# Patient Record
Sex: Female | Born: 1970
Health system: Southern US, Community
[De-identification: ages and names within clinical notes are randomized; demographics above are authoritative.]

## PROBLEM LIST (undated history)

## (undated) DIAGNOSIS — N3281 Overactive bladder: Secondary | ICD-10-CM

## (undated) DIAGNOSIS — N879 Dysplasia of cervix uteri, unspecified: Secondary | ICD-10-CM

## (undated) HISTORY — PX: TONSILLECTOMY AND ADENOIDECTOMY: SUR1326

## (undated) HISTORY — DX: Dysplasia of cervix uteri, unspecified: N87.9

## (undated) HISTORY — DX: Overactive bladder: N32.81

## (undated) HISTORY — PX: CERVICAL BIOPSY  W/ LOOP ELECTRODE EXCISION: SUR135

---

## 2004-10-07 ENCOUNTER — Other Ambulatory Visit: Admission: RE | Admit: 2004-10-07 | Discharge: 2004-10-07 | Payer: Self-pay | Admitting: Family Medicine

## 2005-10-17 ENCOUNTER — Other Ambulatory Visit: Admission: RE | Admit: 2005-10-17 | Discharge: 2005-10-17 | Payer: Self-pay | Admitting: Family Medicine

## 2007-04-29 ENCOUNTER — Other Ambulatory Visit: Admission: RE | Admit: 2007-04-29 | Discharge: 2007-04-29 | Payer: Self-pay | Admitting: Family Medicine

## 2008-05-01 ENCOUNTER — Other Ambulatory Visit: Admission: RE | Admit: 2008-05-01 | Discharge: 2008-05-01 | Payer: Self-pay | Admitting: Family Medicine

## 2008-05-05 ENCOUNTER — Ambulatory Visit (HOSPITAL_COMMUNITY): Admission: RE | Admit: 2008-05-05 | Discharge: 2008-05-05 | Payer: Self-pay | Admitting: Family Medicine

## 2008-06-23 DIAGNOSIS — N879 Dysplasia of cervix uteri, unspecified: Secondary | ICD-10-CM

## 2008-06-23 HISTORY — DX: Dysplasia of cervix uteri, unspecified: N87.9

## 2009-04-23 HISTORY — PX: COLPOSCOPY: SHX161

## 2009-05-03 ENCOUNTER — Other Ambulatory Visit: Admission: RE | Admit: 2009-05-03 | Discharge: 2009-05-03 | Payer: Self-pay | Admitting: Family Medicine

## 2009-05-11 ENCOUNTER — Ambulatory Visit: Payer: Self-pay | Admitting: Gynecology

## 2009-05-14 ENCOUNTER — Ambulatory Visit: Payer: Self-pay | Admitting: Gynecology

## 2009-05-21 ENCOUNTER — Ambulatory Visit: Payer: Self-pay | Admitting: Gynecology

## 2009-08-30 ENCOUNTER — Ambulatory Visit: Payer: Self-pay | Admitting: Gynecology

## 2009-12-03 ENCOUNTER — Ambulatory Visit: Payer: Self-pay | Admitting: Gynecology

## 2009-12-03 ENCOUNTER — Other Ambulatory Visit: Admission: RE | Admit: 2009-12-03 | Discharge: 2009-12-03 | Payer: Self-pay | Admitting: Gynecology

## 2009-12-25 IMAGING — US US TRANSVAGINAL NON-OB
1 series · 13 of 25 positions shown · non-contrast
Comparison: None

CLINICAL DATA: Dysfunctional uterine bleeding.  Prolonged menses
for 3 months.  LMP 04/16/2008

TRANSABDOMINAL AND TRANSVAGINAL ULTRASOUND OF PELVIS
TECHNIQUE: Both transabdominal and transvaginal ultrasound
examinations of the pelvis were performed including evaluation of
the uterus, ovaries, adnexal regions, and pelvic cul-de-sac.

[Series 1: us transvaginal non-ob · 13 of 50 slices shown]
[im 1/50]
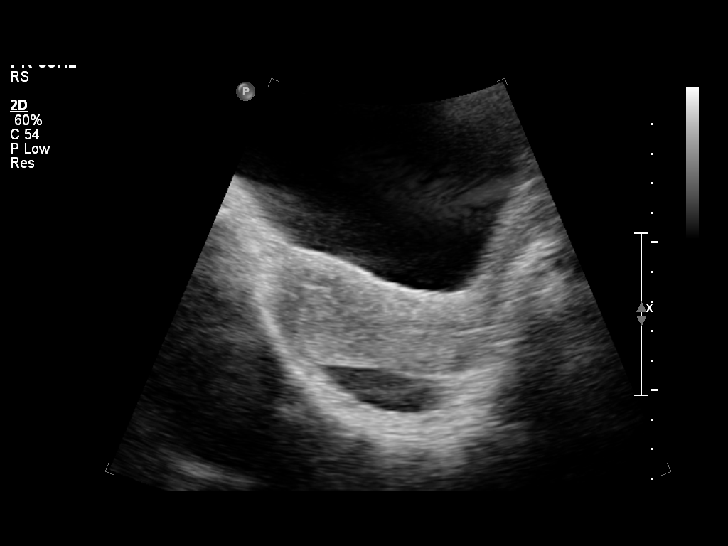
[im 5/50]
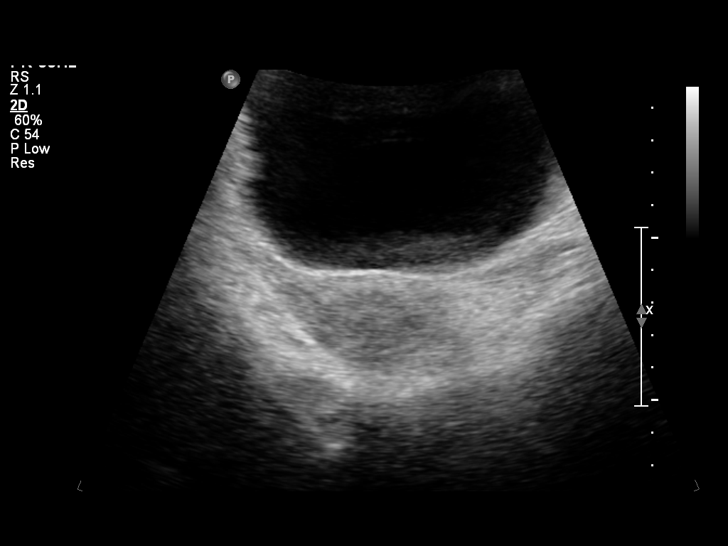
[im 9/50]
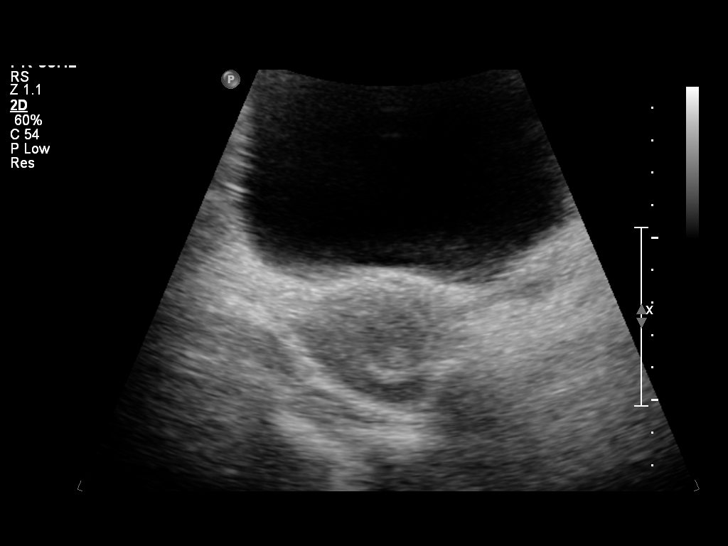
[im 13/50]
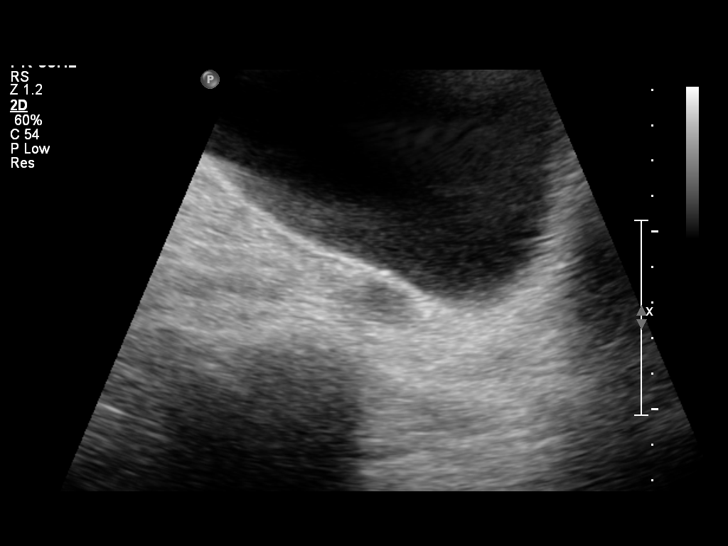
[im 17/50]
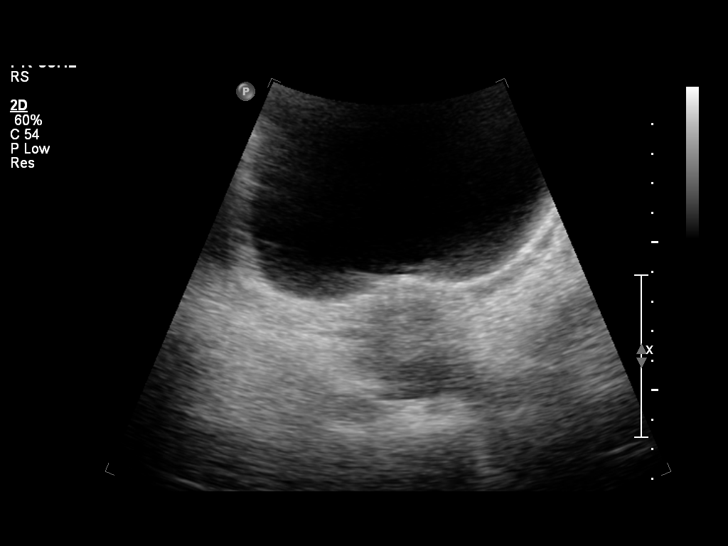
[im 21/50]
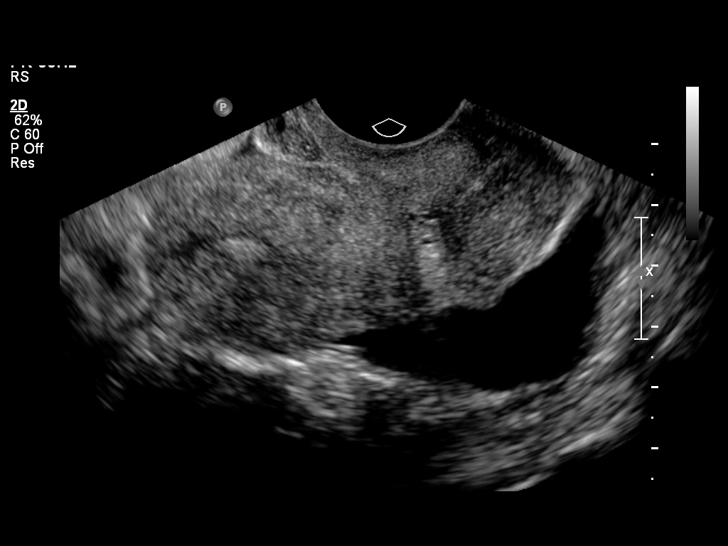
[im 25/50]
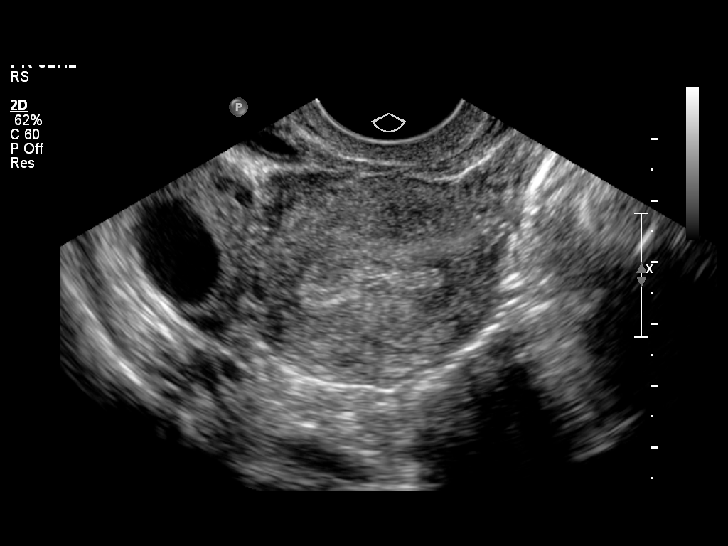
[im 29/50]
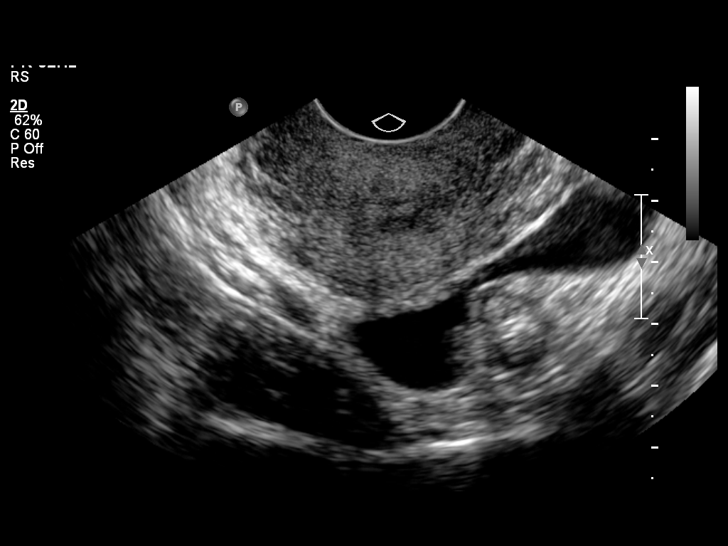
[im 33/50]
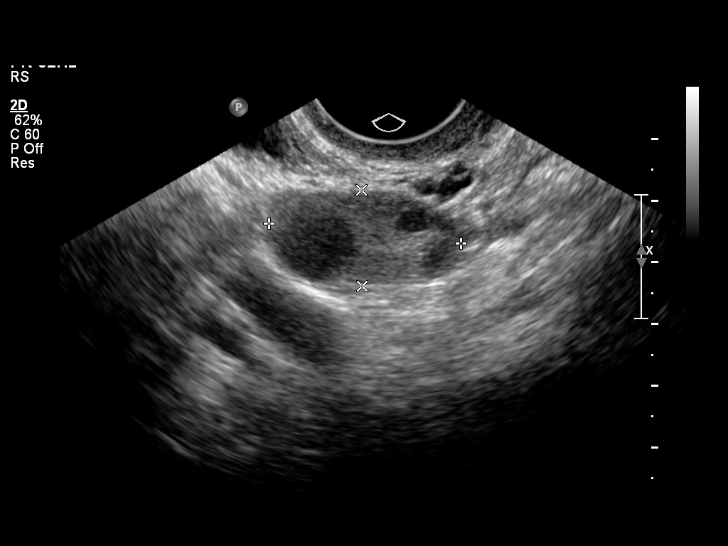
[im 37/50]
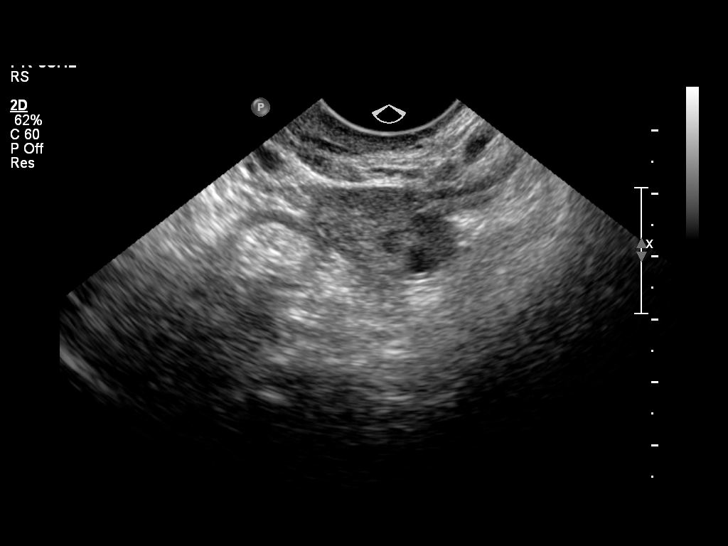
[im 41/50]
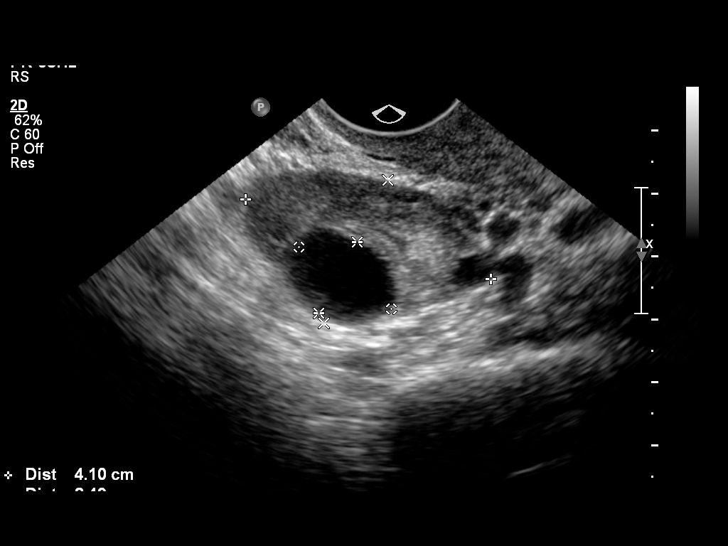
[im 45/50]
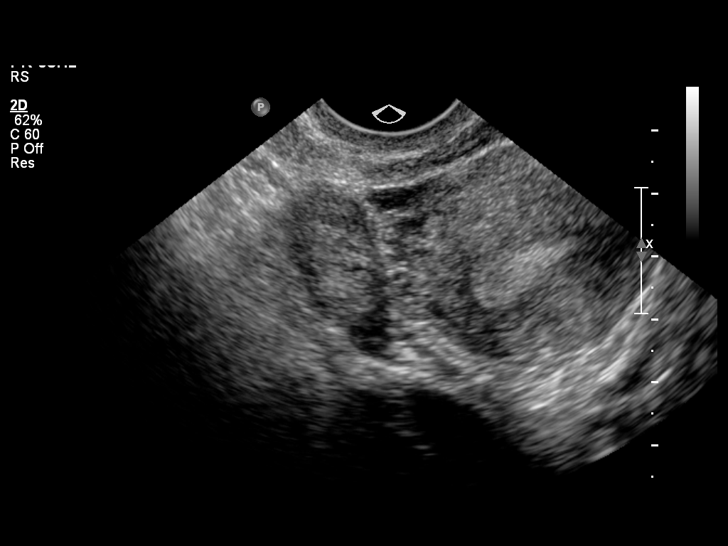
[im 50/50]
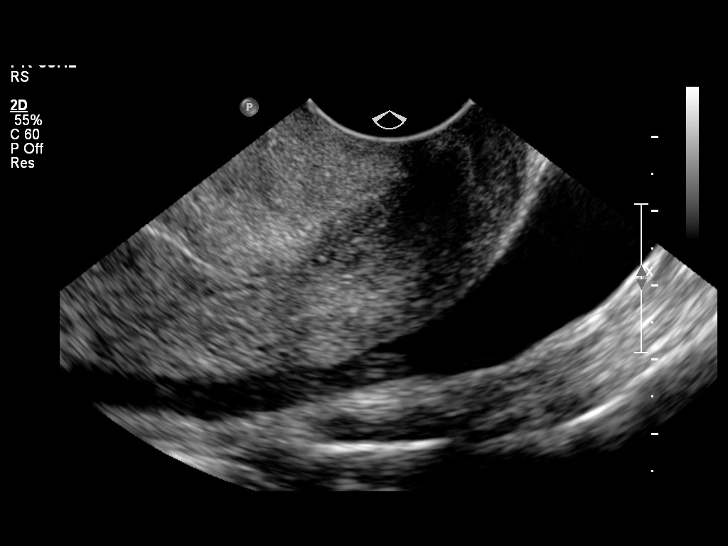

[13 of 25 positions shown; findings below may reference images not displayed]

FINDINGS: The uterus has a sagittal length is 7.4 cm, an AP width
of 3.5 cm and a transverse width of 4.7 cm.  A homogeneous uterine
myometrium is seen.  The endometrial stripe is tri- layered with an
AP width of 6.3 mm.  This would correlate with a periovulatory
endometrial stripe and the patient's given LMP of 04/16/2008. No
focal areas of thickening or inhomogeneity are identified
associated with the endometrial lining.

The left ovary has a normal appearance measuring 3.1 x 1.6 x 2.2 cm
and containing several follicles.  The right ovary measures 4.1 x
2.4 by 2.4 cm and contains a dominant follicle.  A small to
moderate amount of simple free fluid is identified in the cul-de-
sac and given the mid phase of the cycle may be related to recent
ovulation.  No separate adnexal masses are seen.
IMPRESSION: Small to moderate amount of simple free fluid.  Otherwise
unremarkable periovulatory pelvic ultrasound.

## 2010-05-23 ENCOUNTER — Ambulatory Visit: Payer: Self-pay | Admitting: Gynecology

## 2010-05-23 ENCOUNTER — Other Ambulatory Visit
Admission: RE | Admit: 2010-05-23 | Discharge: 2010-05-23 | Payer: Self-pay | Source: Home / Self Care | Admitting: Gynecology

## 2010-05-31 ENCOUNTER — Ambulatory Visit: Payer: Self-pay | Admitting: Gynecology

## 2010-06-06 ENCOUNTER — Ambulatory Visit (HOSPITAL_COMMUNITY): Admission: RE | Admit: 2010-06-06 | Payer: Self-pay | Source: Home / Self Care | Admitting: Gynecology

## 2010-06-24 ENCOUNTER — Ambulatory Visit: Payer: Self-pay | Admitting: Gynecology

## 2010-06-28 ENCOUNTER — Ambulatory Visit
Admission: RE | Admit: 2010-06-28 | Discharge: 2010-06-28 | Payer: Self-pay | Source: Home / Self Care | Attending: Gynecology | Admitting: Gynecology

## 2010-06-28 HISTORY — PX: ENDOMETRIAL ABLATION: SHX621

## 2010-07-12 ENCOUNTER — Ambulatory Visit
Admission: RE | Admit: 2010-07-12 | Discharge: 2010-07-12 | Payer: Self-pay | Source: Home / Self Care | Attending: Gynecology | Admitting: Gynecology

## 2010-07-14 ENCOUNTER — Encounter: Payer: Self-pay | Admitting: Gynecology

## 2010-11-14 ENCOUNTER — Ambulatory Visit (INDEPENDENT_AMBULATORY_CARE_PROVIDER_SITE_OTHER): Payer: BC Managed Care – PPO | Admitting: Gynecology

## 2010-11-14 DIAGNOSIS — R5381 Other malaise: Secondary | ICD-10-CM

## 2011-05-27 ENCOUNTER — Other Ambulatory Visit: Payer: Self-pay | Admitting: Gynecology

## 2011-05-27 DIAGNOSIS — Z1231 Encounter for screening mammogram for malignant neoplasm of breast: Secondary | ICD-10-CM

## 2011-05-30 ENCOUNTER — Ambulatory Visit (INDEPENDENT_AMBULATORY_CARE_PROVIDER_SITE_OTHER): Payer: BC Managed Care – PPO | Admitting: Gynecology

## 2011-05-30 ENCOUNTER — Encounter: Payer: Self-pay | Admitting: Gynecology

## 2011-05-30 ENCOUNTER — Other Ambulatory Visit (HOSPITAL_COMMUNITY)
Admission: RE | Admit: 2011-05-30 | Discharge: 2011-05-30 | Disposition: A | Discharge status: Home / Self Care | Payer: BC Managed Care – PPO | Source: Ambulatory Visit | Attending: Gynecology | Admitting: Gynecology

## 2011-05-30 VITALS — BP 120/74 | Ht 64.5 in | Wt 136.0 lb

## 2011-05-30 DIAGNOSIS — Z131 Encounter for screening for diabetes mellitus: Secondary | ICD-10-CM

## 2011-05-30 DIAGNOSIS — Z13220 Encounter for screening for lipoid disorders: Secondary | ICD-10-CM

## 2011-05-30 DIAGNOSIS — Z01419 Encounter for gynecological examination (general) (routine) without abnormal findings: Secondary | ICD-10-CM

## 2011-05-30 DIAGNOSIS — N879 Dysplasia of cervix uteri, unspecified: Secondary | ICD-10-CM | POA: Insufficient documentation

## 2011-05-30 LAB — GLUCOSE, RANDOM: Glucose, Bld: 81 mg/dL (ref 70–99)

## 2011-05-30 NOTE — Patient Instructions (Signed)
Annual GYN follow up

## 2011-05-30 NOTE — Progress Notes (Signed)
Roshan Salamon 04-25-1971 [id number]        40 y.o.  for annual exam.  Doing well no complaints.  Past medical history,surgical history, medications, allergies, family history and social history were all reviewed and documented in the EPIC chart. ROS:  Was performed and pertinent positives and negatives are included in the history.  Exam: chaperone present Filed Vitals:   05/30/11 1200  BP: 120/74   General appearance  Normal Skin grossly normal Head/Neck normal with no cervical or supraclavicular adenopathy thyroid normal Lungs  clear Cardiac RR, without RMG Abdominal  soft, nontender, without masses, organomegaly or hernia Breasts  examined lying and sitting without masses, retractions, discharge or axillary adenopathy. Pelvic  Ext/BUS/vagina  normal   Cervix  normal  Pap done  Uterus  anteverted, normal size, shape and contour, midline and mobile nontender   Adnexa  Without masses or tenderness    Anus and perineum  normal   Rectovaginal  normal sphincter tone without palpated masses or tenderness.    Assessment/Plan:  40 y.o. female for annual exam.   Doing well. Late menses since ablation. Vasectomy birth control. SBE monthly reviewed. Has mammogram scheduled in January 2013. Pap smear done this year given history of mild atypia. If this Pap smear is normal will go to less frequent screening every 3 years if she will have several normals in her chart. Baseline CBC lipid profile glucose urinalysis ordered. Assuming she continues well from a gynecologic standpoint she will see me in a year sooner as needed.    Dara Lords MD, 12:37 PM 05/30/2011

## 2011-07-03 ENCOUNTER — Ambulatory Visit (HOSPITAL_COMMUNITY)
Admission: RE | Admit: 2011-07-03 | Discharge: 2011-07-03 | Disposition: A | Discharge status: Home / Self Care | Payer: BC Managed Care – PPO | Source: Ambulatory Visit | Attending: Gynecology | Admitting: Gynecology

## 2011-07-03 DIAGNOSIS — Z1231 Encounter for screening mammogram for malignant neoplasm of breast: Secondary | ICD-10-CM | POA: Insufficient documentation

## 2012-04-22 ENCOUNTER — Other Ambulatory Visit: Payer: Self-pay | Admitting: Endocrinology

## 2012-04-22 DIAGNOSIS — E010 Iodine-deficiency related diffuse (endemic) goiter: Secondary | ICD-10-CM

## 2012-04-26 ENCOUNTER — Ambulatory Visit
Admission: RE | Admit: 2012-04-26 | Discharge: 2012-04-26 | Disposition: A | Discharge status: Home / Self Care | Payer: BC Managed Care – PPO | Source: Ambulatory Visit | Attending: Endocrinology | Admitting: Endocrinology

## 2012-04-26 DIAGNOSIS — E010 Iodine-deficiency related diffuse (endemic) goiter: Secondary | ICD-10-CM

## 2012-06-03 ENCOUNTER — Encounter: Payer: Self-pay | Admitting: Gynecology

## 2012-06-03 ENCOUNTER — Ambulatory Visit (INDEPENDENT_AMBULATORY_CARE_PROVIDER_SITE_OTHER): Payer: BC Managed Care – PPO | Admitting: Gynecology

## 2012-06-03 VITALS — BP 110/70 | Ht 63.75 in | Wt 126.0 lb

## 2012-06-03 DIAGNOSIS — Z01419 Encounter for gynecological examination (general) (routine) without abnormal findings: Secondary | ICD-10-CM

## 2012-06-03 NOTE — Progress Notes (Signed)
Caroline Whitehead 1971/05/08 [id number]        41 y.o.  G1P1001 for annual exam.  Doing well without complaints.  Past medical history,surgical history, medications, allergies, family history and social history were all reviewed and documented in the EPIC chart. ROS:  Was performed and pertinent positives and negatives are included in the history.  Exam: Sherrilyn Rist assistant Filed Vitals:   06/03/12 1202  BP: 110/70  Height: 5' 3.75" (1.619 m)  Weight: 126 lb (57.153 kg)   General appearance  Normal Skin grossly normal Head/Neck normal with no cervical or supraclavicular adenopathy thyroid normal Lungs  clear Cardiac RR, without RMG Abdominal  soft, nontender, without masses, organomegaly or hernia Breasts  examined lying and sitting without masses, retractions, discharge or axillary adenopathy. Pelvic  Ext/BUS/vagina  normal   Cervix  normal   Uterus  anteverted, normal size, shape and contour, midline and mobile nontender   Adnexa  Without masses or tenderness    Anus and perineum  normal   Rectovaginal  normal sphincter tone without palpated masses or tenderness.    Assessment/Plan:  41 y.o. G70P1001 female for annual exam, regular menses, vasectomy birth control.   1. History endometrial ablation. Patient's menses are monthly and lighter in acceptable to her. We'll continue to monitor. 2. Mammography coming due January 2014. I reminded her to schedule this. SBE monthly reviewed. 3. Pap smear 2012. No Pap smear done today.  History of mild atypia 2010 with follow up Pap smears normal x3.  Plan repeat Pap 3 year interval. 4. Health maintenance. No labs done today as it is all done through her primary physician's office who she sees on a regular basis. Follow up one year, sooner as needed.    Dara Lords MD, 12:15 PM 06/03/2012

## 2012-06-03 NOTE — Patient Instructions (Signed)
Follow up one year, sooner as needed.

## 2012-09-13 ENCOUNTER — Other Ambulatory Visit: Payer: Self-pay | Admitting: Gynecology

## 2012-09-13 DIAGNOSIS — Z1231 Encounter for screening mammogram for malignant neoplasm of breast: Secondary | ICD-10-CM

## 2012-09-16 ENCOUNTER — Ambulatory Visit (HOSPITAL_COMMUNITY)
Admission: RE | Admit: 2012-09-16 | Discharge: 2012-09-16 | Disposition: A | Discharge status: Home / Self Care | Payer: BC Managed Care – PPO | Source: Ambulatory Visit | Attending: Gynecology | Admitting: Gynecology

## 2012-09-16 DIAGNOSIS — Z1231 Encounter for screening mammogram for malignant neoplasm of breast: Secondary | ICD-10-CM

## 2012-12-06 ENCOUNTER — Telehealth: Payer: Self-pay | Admitting: *Deleted

## 2012-12-06 MED ORDER — MEGESTROL ACETATE 20 MG PO TABS: 20.0000 mg | ORAL_TABLET | Freq: Every day | ORAL | Status: DC

## 2012-12-06 NOTE — Telephone Encounter (Signed)
Pt had history of endometrial ablation, pt called c/o bleeding x 3 weeks now. Pt LMP:11/12/12 1-2 days of bleeding the spotting afterward for about 3 weeks now. Pt said this would be the first time this has happened since the ablation. Pt asked if she should watch for now? Make OV? If this continues. Please advise

## 2012-12-06 NOTE — Telephone Encounter (Signed)
Recommend Megace 20 mg daily for 10 days. Office visit if continues.

## 2012-12-06 NOTE — Telephone Encounter (Signed)
Pt informed with the below note, rx sent. 

## 2013-07-11 ENCOUNTER — Other Ambulatory Visit (HOSPITAL_COMMUNITY)
Admission: RE | Admit: 2013-07-11 | Discharge: 2013-07-11 | Disposition: A | Discharge status: Home / Self Care | Payer: BC Managed Care – PPO | Source: Ambulatory Visit | Attending: Gynecology | Admitting: Gynecology

## 2013-07-11 ENCOUNTER — Encounter: Payer: Self-pay | Admitting: Gynecology

## 2013-07-11 ENCOUNTER — Ambulatory Visit (INDEPENDENT_AMBULATORY_CARE_PROVIDER_SITE_OTHER): Payer: BC Managed Care – PPO | Admitting: Gynecology

## 2013-07-11 VITALS — BP 120/76 | Ht 63.0 in | Wt 143.0 lb

## 2013-07-11 DIAGNOSIS — Z01419 Encounter for gynecological examination (general) (routine) without abnormal findings: Secondary | ICD-10-CM | POA: Insufficient documentation

## 2013-07-11 DIAGNOSIS — Z1151 Encounter for screening for human papillomavirus (HPV): Secondary | ICD-10-CM | POA: Insufficient documentation

## 2013-07-11 LAB — CBC WITH DIFFERENTIAL/PLATELET
BASOS PCT: 0 % (ref 0–1)
Basophils Absolute: 0 10*3/uL (ref 0.0–0.1)
EOS ABS: 0.2 10*3/uL (ref 0.0–0.7)
EOS PCT: 3 % (ref 0–5)
HCT: 41.3 % (ref 36.0–46.0)
Hemoglobin: 14.2 g/dL (ref 12.0–15.0)
Lymphocytes Relative: 28 % (ref 12–46)
Lymphs Abs: 1.9 10*3/uL (ref 0.7–4.0)
MCH: 31.3 pg (ref 26.0–34.0)
MCHC: 34.4 g/dL (ref 30.0–36.0)
MCV: 91 fL (ref 78.0–100.0)
Monocytes Absolute: 0.5 10*3/uL (ref 0.1–1.0)
Monocytes Relative: 7 % (ref 3–12)
NEUTROS PCT: 62 % (ref 43–77)
Neutro Abs: 4.3 10*3/uL (ref 1.7–7.7)
PLATELETS: 308 10*3/uL (ref 150–400)
RBC: 4.54 MIL/uL (ref 3.87–5.11)
RDW: 12.8 % (ref 11.5–15.5)
WBC: 6.9 10*3/uL (ref 4.0–10.5)

## 2013-07-11 LAB — COMPREHENSIVE METABOLIC PANEL
ALBUMIN: 4.6 g/dL (ref 3.5–5.2)
ALK PHOS: 46 U/L (ref 39–117)
ALT: 12 U/L (ref 0–35)
AST: 17 U/L (ref 0–37)
BUN: 13 mg/dL (ref 6–23)
CO2: 25 mEq/L (ref 19–32)
Calcium: 9.3 mg/dL (ref 8.4–10.5)
Chloride: 103 mEq/L (ref 96–112)
Creat: 0.79 mg/dL (ref 0.50–1.10)
Glucose, Bld: 86 mg/dL (ref 70–99)
POTASSIUM: 4.1 meq/L (ref 3.5–5.3)
SODIUM: 138 meq/L (ref 135–145)
TOTAL PROTEIN: 6.9 g/dL (ref 6.0–8.3)
Total Bilirubin: 0.3 mg/dL (ref 0.3–1.2)

## 2013-07-11 LAB — LIPID PANEL
CHOLESTEROL: 178 mg/dL (ref 0–200)
HDL: 71 mg/dL (ref >39)
LDL Cholesterol: 74 mg/dL (ref 0–99)
Total CHOL/HDL Ratio: 2.5 Ratio
Triglycerides: 163 mg/dL — ABNORMAL HIGH (ref <150)
VLDL: 33 mg/dL (ref 0–40)

## 2013-07-11 NOTE — Progress Notes (Signed)
Caroline MerrySonia Southall 03/15/1971 [id number]        43 y.o.  G1P1001 for annual exam.  Several issues noted below.  Past medical history,surgical history, problem list, medications, allergies, family history and social history were all reviewed and documented in the EPIC chart.  ROS:  Performed and pertinent positives and negatives are included in the history, assessment and plan .  Exam: [facility] assistant Filed Vitals:   07/11/13 1504  BP: 120/76  Height: 5\' 3"  (1.6 m)  Weight: 143 lb (64.864 kg)   General appearance  Normal Skin grossly normal Head/Neck normal with no cervical or supraclavicular adenopathy thyroid normal Lungs  clear Cardiac RR, without RMG Abdominal  soft, nontender, without masses, organomegaly or hernia Breasts  examined lying and sitting without masses, retractions, discharge or axillary adenopathy. Pelvic  Ext/BUS/vagina  Normal  Cervix  Normal. Pap/HPV done  Uterus  anteverted, normal size, shape and contour, midline and mobile nontender   Adnexa  Without masses or tenderness    Anus and perineum  Normal   Rectovaginal  Normal sphincter tone without palpated masses or tenderness.    Assessment/Plan:  43 y.o. 391P1001 female for annual exam regular menses, vasectomy birth control.   1. History of HerOption endometrial ablation. Done well with regular menses. Continue to monitor. 2. Pap smear 2012. Pap/HPV today. History of koilocytotic atypia 2010 with normal Pap smears following. Plan less frequent screening intervals per current screening guidelines every 3-5 years assuming this Pap smear is normal. 3. Mammography 08/2012. Continue with annual mammography. SBE monthly reviewed. 4. Health maintenance. Baseline CBC comprehensive metabolic panel lipid profile urinalysis ordered. Thyroid panel also ordered due to complaints of difficulty losing weight and fatigue. Followup one year, sooner as needed.   Note: This document was prepared with digital dictation and possible  smart phrase technology. Any transcriptional errors that result from this process are unintentional.   Dara LordsFONTAINE,Augustino Savastano P MD, 3:29 PM 07/11/2013

## 2013-07-11 NOTE — Patient Instructions (Signed)
Follow up in one year for annual exam 

## 2013-07-11 NOTE — Addendum Note (Signed)
Addended by: Dayna BarkerGARDNER, Jess Toney K on: 07/11/2013 04:12 PM   Modules accepted: Orders

## 2013-07-12 LAB — URINALYSIS W MICROSCOPIC + REFLEX CULTURE
BILIRUBIN URINE: NEGATIVE
CRYSTALS: NONE SEEN
Casts: NONE SEEN
GLUCOSE, UA: NEGATIVE mg/dL
Hgb urine dipstick: NEGATIVE
Ketones, ur: NEGATIVE mg/dL
LEUKOCYTES UA: NEGATIVE
Nitrite: NEGATIVE
Protein, ur: NEGATIVE mg/dL
SQUAMOUS EPITHELIAL / LPF: NONE SEEN
UROBILINOGEN UA: 0.2 mg/dL (ref 0.0–1.0)
pH: 6.5 (ref 5.0–8.0)

## 2013-07-12 LAB — THYROID PANEL
FREE T4: 1.21 ng/dL (ref 0.80–1.80)
T3 UPTAKE: 30.4 % (ref 22.5–37.0)
T4, Total: 10.6 ug/dL (ref 5.0–12.5)
TSH: 2.366 u[IU]/mL (ref 0.350–4.500)

## 2013-07-13 ENCOUNTER — Other Ambulatory Visit: Payer: Self-pay | Admitting: Gynecology

## 2013-07-13 MED ORDER — SULFAMETHOXAZOLE-TMP DS 800-160 MG PO TABS: 1.0000 | ORAL_TABLET | Freq: Two times a day (BID) | ORAL | Status: DC

## 2013-07-14 LAB — URINE CULTURE: Colony Count: >=100000

## 2013-09-08 ENCOUNTER — Other Ambulatory Visit: Payer: Self-pay | Admitting: Gynecology

## 2013-09-08 DIAGNOSIS — Z1231 Encounter for screening mammogram for malignant neoplasm of breast: Secondary | ICD-10-CM

## 2013-09-19 ENCOUNTER — Ambulatory Visit (HOSPITAL_COMMUNITY)
Admission: RE | Admit: 2013-09-19 | Discharge: 2013-09-19 | Disposition: A | Discharge status: Home / Self Care | Payer: BC Managed Care – PPO | Source: Ambulatory Visit | Attending: Gynecology | Admitting: Gynecology

## 2013-09-19 DIAGNOSIS — Z1231 Encounter for screening mammogram for malignant neoplasm of breast: Secondary | ICD-10-CM

## 2013-12-16 IMAGING — US US SOFT TISSUE HEAD/NECK
1 series · 14 of 25 positions shown · non-contrast
Comparison: None.

CLINICAL DATA: Thyromegaly.

THYROID ULTRASOUND
TECHNIQUE: Ultrasound examination of the thyroid gland and adjacent
soft tissues was performed.

[Series 1: us soft tissue head/neck · 0.07mm/px · 14 of 40 slices shown]
[im 1/40]
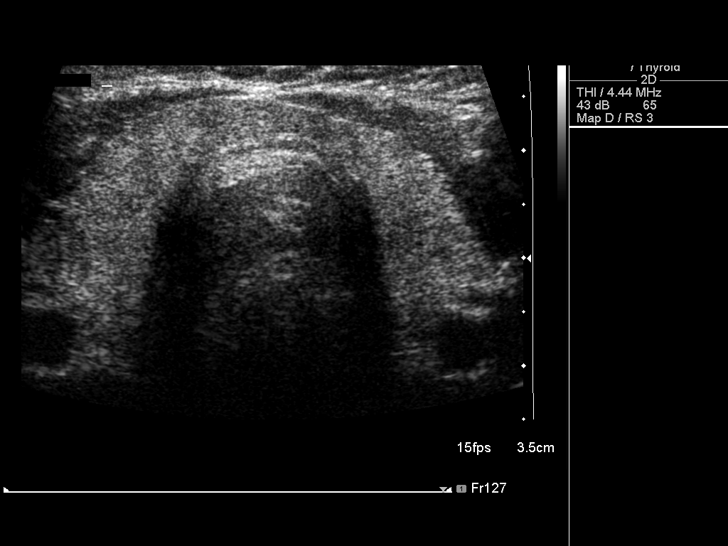
[im 4/40]
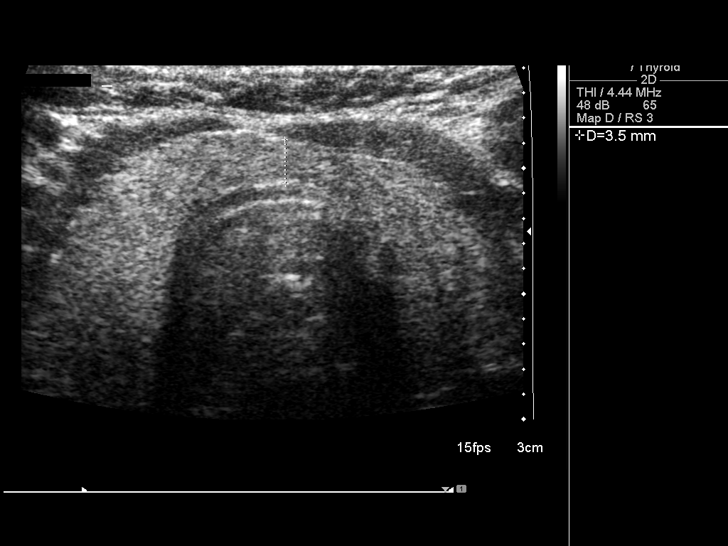
[im 7/40]
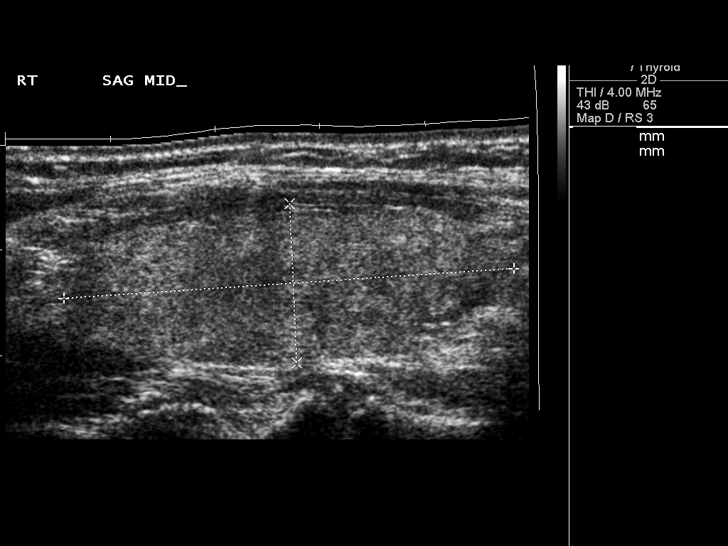
[im 10/40]
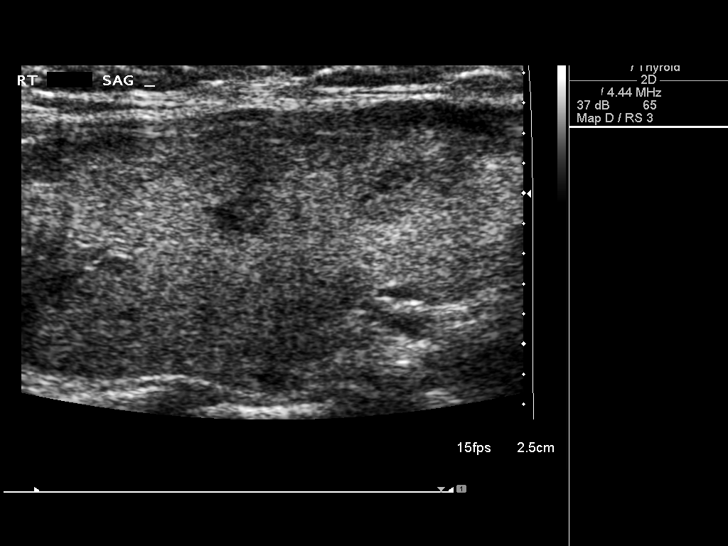
[im 14/40]
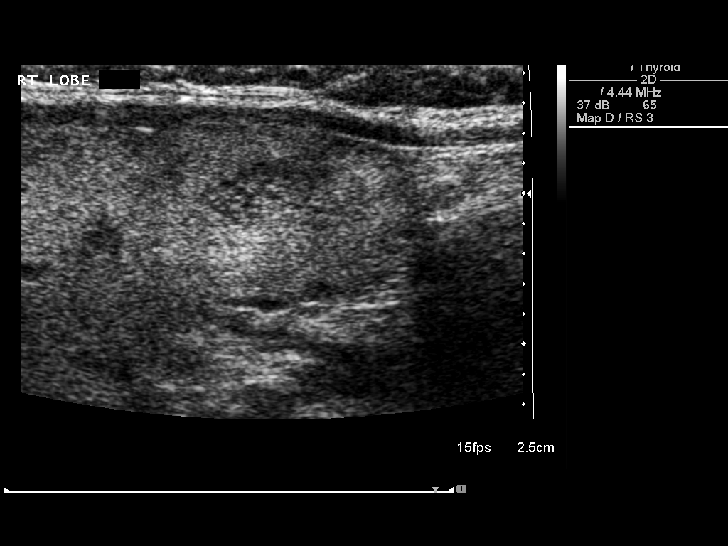
[im 15/40]
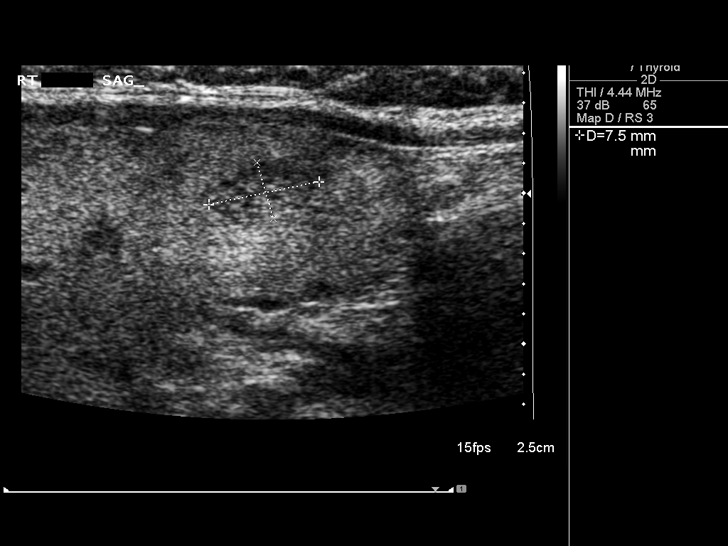
[im 18/40]
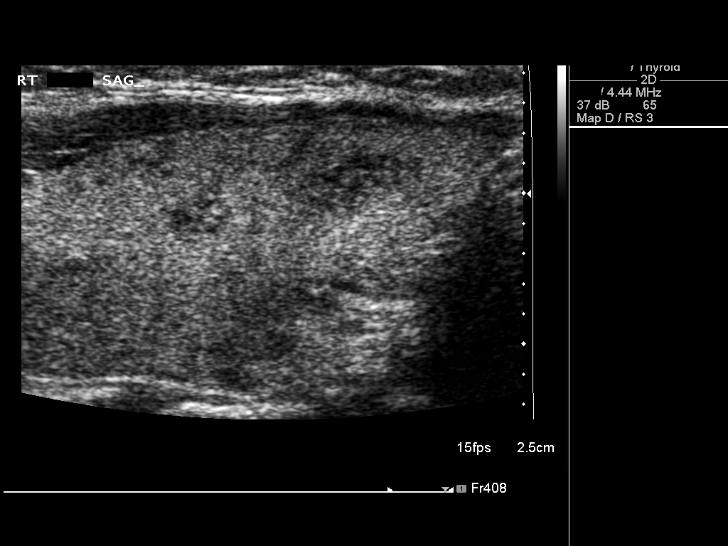
[im 22/40]
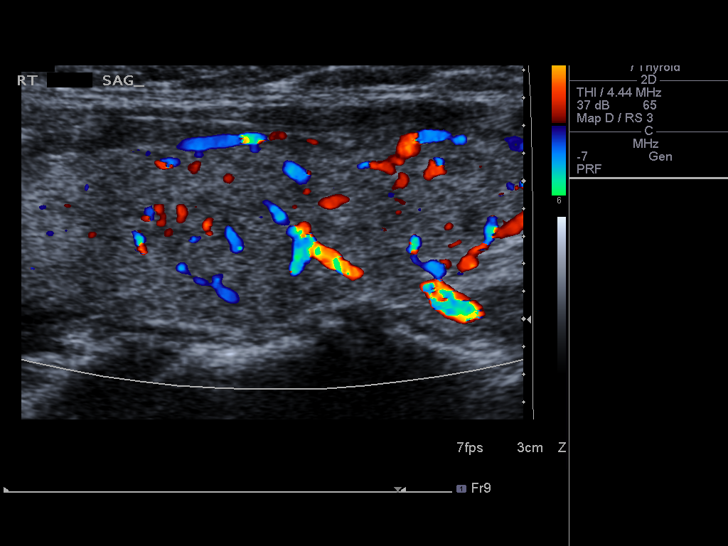
[im 25/40]
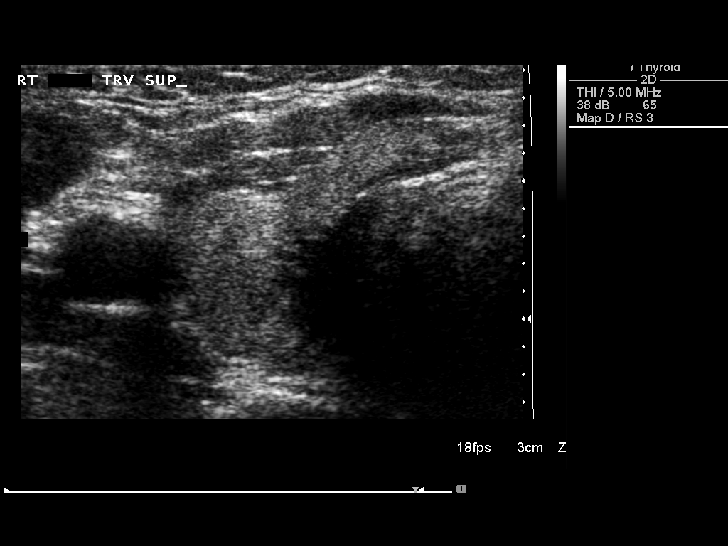
[im 27/40]
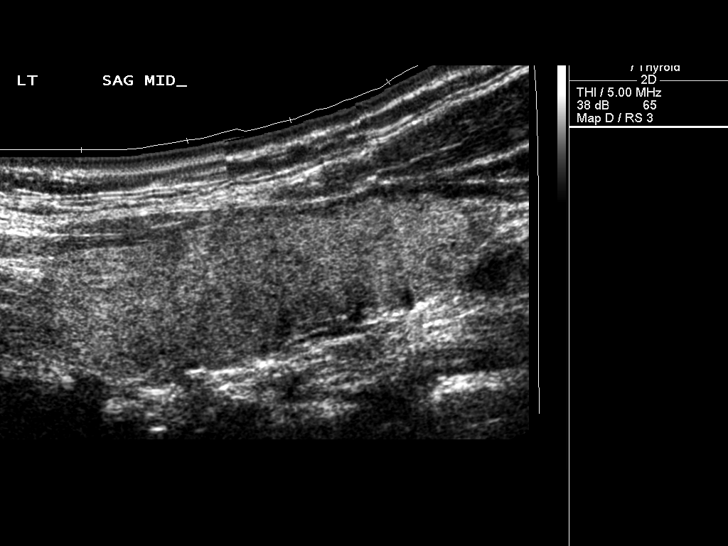
[im 30/40]
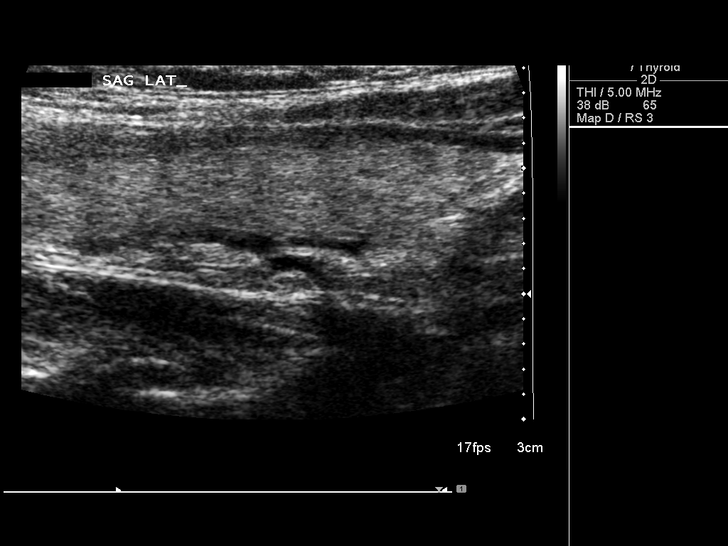
[im 33/40]
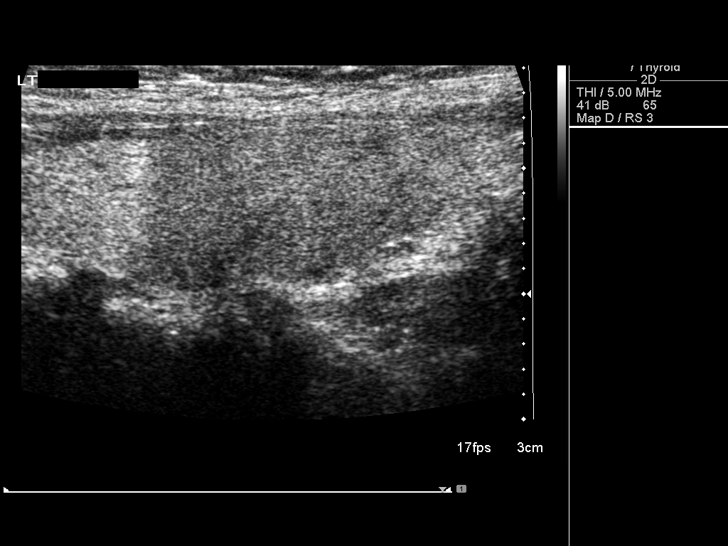
[im 36/40]
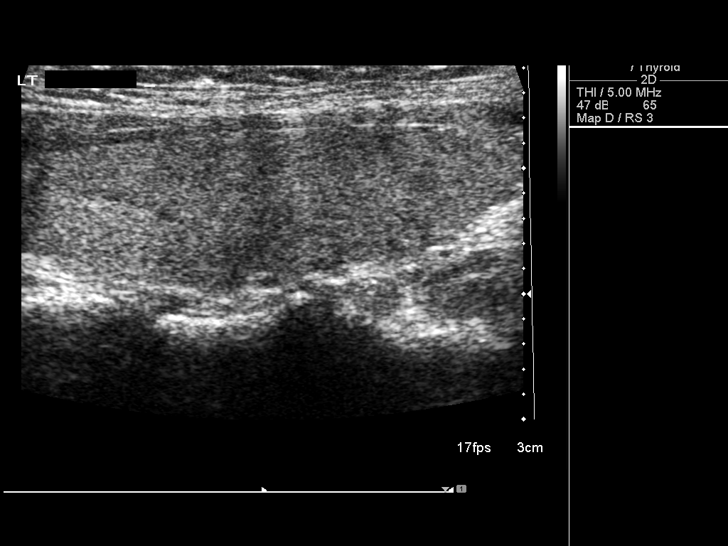
[im 40/40]
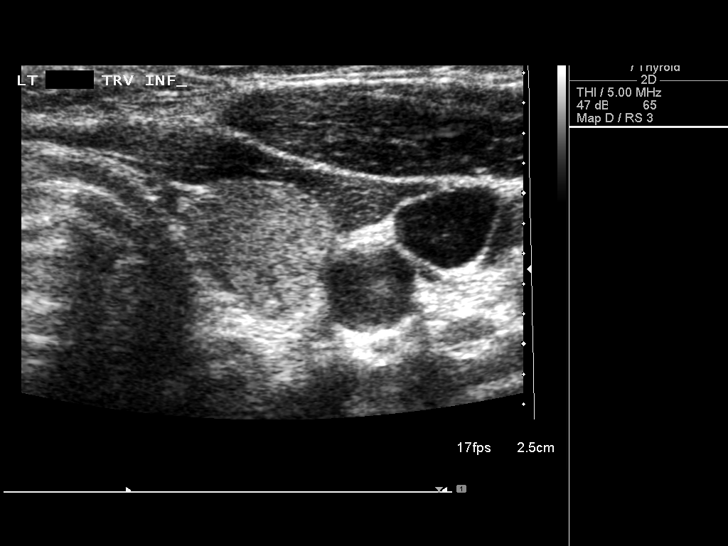

[14 of 25 positions shown; findings below may reference images not displayed]

FINDINGS: Right thyroid lobe:  4.3 x 1.5 x 1.5 cm.
Left thyroid lobe:  4.2 x 1.5 x 1.5 cm.
Isthmus:  0.4 cm.

Focal nodules:  Solid slightly hypoechoic 0.4 x 0.3 x 0.4 cm nodule
in the midportion of the right lobe.  Solid 0.8 x 0.4 x 0.6 cm
nodule in the lower pole of the right lobe.  The gland is otherwise
normal.

Lymphadenopathy:  None visualized.
IMPRESSION: Two small hypoechoic nodules in the right lobe of the gland.
Otherwise,  normal.  Findings consistent with slight multi nodular
goiter.

## 2014-04-18 ENCOUNTER — Encounter: Payer: Self-pay | Admitting: Gynecology

## 2014-04-24 ENCOUNTER — Encounter: Payer: Self-pay | Admitting: Gynecology

## 2014-08-03 ENCOUNTER — Ambulatory Visit (INDEPENDENT_AMBULATORY_CARE_PROVIDER_SITE_OTHER): Payer: BLUE CROSS/BLUE SHIELD | Admitting: Gynecology

## 2014-08-03 ENCOUNTER — Encounter: Payer: Self-pay | Admitting: Gynecology

## 2014-08-03 VITALS — BP 116/70 | Ht 64.0 in | Wt 149.0 lb

## 2014-08-03 DIAGNOSIS — N926 Irregular menstruation, unspecified: Secondary | ICD-10-CM

## 2014-08-03 DIAGNOSIS — R635 Abnormal weight gain: Secondary | ICD-10-CM

## 2014-08-03 DIAGNOSIS — N3946 Mixed incontinence: Secondary | ICD-10-CM

## 2014-08-03 DIAGNOSIS — Z01419 Encounter for gynecological examination (general) (routine) without abnormal findings: Secondary | ICD-10-CM

## 2014-08-03 LAB — LIPID PANEL
CHOLESTEROL: 172 mg/dL (ref 0–200)
HDL: 57 mg/dL (ref >39)
LDL CALC: 86 mg/dL (ref 0–99)
TRIGLYCERIDES: 143 mg/dL (ref <150)
Total CHOL/HDL Ratio: 3 Ratio
VLDL: 29 mg/dL (ref 0–40)

## 2014-08-03 LAB — CBC WITH DIFFERENTIAL/PLATELET
Basophils Absolute: 0 10*3/uL (ref 0.0–0.1)
Basophils Relative: 0 % (ref 0–1)
Eosinophils Absolute: 0.1 10*3/uL (ref 0.0–0.7)
Eosinophils Relative: 2 % (ref 0–5)
HCT: 39.9 % (ref 36.0–46.0)
Hemoglobin: 13.6 g/dL (ref 12.0–15.0)
Lymphocytes Relative: 30 % (ref 12–46)
Lymphs Abs: 1.7 10*3/uL (ref 0.7–4.0)
MCH: 31.3 pg (ref 26.0–34.0)
MCHC: 34.1 g/dL (ref 30.0–36.0)
MCV: 91.9 fL (ref 78.0–100.0)
MONO ABS: 0.3 10*3/uL (ref 0.1–1.0)
MPV: 9.4 fL (ref 8.6–12.4)
Monocytes Relative: 6 % (ref 3–12)
Neutro Abs: 3.5 10*3/uL (ref 1.7–7.7)
Neutrophils Relative %: 62 % (ref 43–77)
PLATELETS: 296 10*3/uL (ref 150–400)
RBC: 4.34 MIL/uL (ref 3.87–5.11)
RDW: 13.3 % (ref 11.5–15.5)
WBC: 5.6 10*3/uL (ref 4.0–10.5)

## 2014-08-03 LAB — COMPREHENSIVE METABOLIC PANEL
ALBUMIN: 4.5 g/dL (ref 3.5–5.2)
ALT: 12 U/L (ref 0–35)
AST: 19 U/L (ref 0–37)
Alkaline Phosphatase: 45 U/L (ref 39–117)
BUN: 11 mg/dL (ref 6–23)
CHLORIDE: 106 meq/L (ref 96–112)
CO2: 23 mEq/L (ref 19–32)
Calcium: 9.9 mg/dL (ref 8.4–10.5)
Creat: 0.8 mg/dL (ref 0.50–1.10)
Glucose, Bld: 89 mg/dL (ref 70–99)
Potassium: 4.1 mEq/L (ref 3.5–5.3)
Sodium: 141 mEq/L (ref 135–145)
Total Bilirubin: 0.3 mg/dL (ref 0.2–1.2)
Total Protein: 7.2 g/dL (ref 6.0–8.3)

## 2014-08-03 MED ORDER — SOLIFENACIN SUCCINATE 10 MG PO TABS: 10.0000 mg | ORAL_TABLET | Freq: Every day | ORAL | Status: DC

## 2014-08-03 NOTE — Progress Notes (Signed)
Caroline Whitehead 07/14/1970 [id number]        44 y.o.  G1P1001 for annual exam.  Several issues noted below.  Past medical history,surgical history, problem list, medications, allergies, family history and social history were all reviewed and documented as reviewed in the EPIC chart.  ROS:  Performed with pertinent positives and negatives included in the history, assessment and plan.   Additional significant findings :  none   Exam: [facility] personassistant Filed Vitals:   08/03/14 0937  BP: 116/70  Height: 5\' 4"  (1.626 m)  Weight: 149 lb (67.586 kg)   General appearance:  Normal affect, orientation and appearance. Skin: Grossly normal HEENT: Without gross lesions.  No cervical or supraclavicular adenopathy. Thyroid normal.  Lungs:  Clear without wheezing, rales or rhonchi Cardiac: RR, without RMG Abdominal:  Soft, nontender, without masses, guarding, rebound, organomegaly or hernia Breasts:  Examined lying and sitting without masses, retractions, discharge or axillary adenopathy. Pelvic:  Ext/BUS/vagina normal  Cervix normal  Uterus anteverted, normal size, shape and contour, midline and mobile nontender   Adnexa  Without masses or tenderness    Anus and perineum  Normal   Rectovaginal  Normal sphincter tone without palpated masses or tenderness.    Assessment/Plan:  44 y.o. 601P1001 female for annual exam with mild irregular menses, vasectomy birth control.   1. Mild irregular menses. Patient notes over the past year since her daughter has started her menses her area to become a little irregular which she will have sometimes 2 a month corresponding to when her daughter has her irregular menses. Also notes some hot flushes. Will check baseline FSH TSH. Will keep menstrual calendar for now. If significant irregularity then will follow up for further evaluation. Is status post endometrial ablation 2012 and otherwise having light menses. 2. Hot flushes, weight gain, dry skin. We'll check thyroid  panel. Also having difficulty losing weight. Reviewed metabolic changes in the 40s as probable etiology but will rule out thyroid dysfunction. 3. Mixed incontinence. Patient with both stress and urgency type symptoms seeming to get worse over the past year or so. Finding that she has to rush to the bathroom to void with also loss of urine with coughing sneezing laughing. Check urinalysis today. Reviewed issues of incontinence with behavior modification to include frequent voiding and avoidance of bladder irritants such as caffeine/carbonated beverages. Kegel exercises also reviewed. Options for referral to urology to consider surgery also discussed. Medications for the urgency symptoms reviewed with side effect profiles discussed to include anticholinergic side effects and blood pressure issues with Myrbetriq. Patient wants trauma medication first. Vesicare 10 mg daily prescribed as covered by her insurance #[id number] with 1 refill. Patient will call me within the first 1-2 months to see how she's doing and for refill if doing well. 4. Mammogram coming due in March and I reminded her to schedule it. SBE monthly reviewed. 5. Pap smear/HPV negative 06/2013. No Pap smear done today. History of koilocytotic atypia 2010 but no other significant abnormal Pap smears. Plan repeat at 3-5 year interval per current screening guidelines. 6. Health maintenance. Baseline CBC comprehensive metabolic panel lipid profile vitamin D urinalysis ordered along with her thyroid panel and FSH. Follow up with response to Vesicare.  Otherwise annually for exam.     Dara LordsFONTAINE,Mayson Sterbenz P MD, 10:02 AM 08/03/2014

## 2014-08-03 NOTE — Patient Instructions (Addendum)
Start on the Vesicare medication. Call me within the first 1-2 months to let me know how you're doing.   Solifenacin tablets What is this medicine? SOLIFENACIN (sol i FEN a cin) is used to treat overactive bladder. This medicine reduces the amount of bathroom visits. It may also help to control wetting accidents. This medicine may be used for other purposes; ask your health care provider or pharmacist if you have questions. COMMON BRAND NAME(S): VESIcare What should I tell my health care provider before I take this medicine? They need to know if you have any of these conditions: -difficulty passing urine -glaucoma -intestinal obstruction -kidney disease -liver disease -an unusual or allergic reaction to solifenacin, other medicines, foods, dyes, or preservatives -pregnant or trying to get pregnant -breast-feeding How should I use this medicine? Take this medicine by mouth with a glass of water. Follow the directions on the prescription label. This medicine can be taken with or without food. Take your doses at regular intervals. Do not take your medicine more often than directed. Do not stop taking except on the advice of your doctor or health care professional. Talk to your pediatrician regarding the use of this medicine in children. Special care may be needed. Overdosage: If you think you have taken too much of this medicine contact a poison control center or emergency room at once. NOTE: This medicine is only for you. Do not share this medicine with others. What if I miss a dose? If you miss a dose, take it as soon as you can. If it is almost time for your next dose, take only that dose. Do not take double or extra doses. What may interact with this medicine? Do not take this medicine with any of the following medications: -certain medicines for fungal infections like fluconazole, itraconazole, ketoconazole, posaconazole,  voriconazole -cisapride -dofetilide -dronedarone -pimozide -thioridazine -ziprasidone This medicine may also interact with the following medications: -atropine -other medicines that prolong the QT interval (cause an abnormal heart rhythm) -oxybutynin -scopolamine This list may not describe all possible interactions. Give your health care provider a list of all the medicines, herbs, non-prescription drugs, or dietary supplements you use. Also tell them if you smoke, drink alcohol, or use illegal drugs. Some items may interact with your medicine. What should I watch for while using this medicine? It may take 2 or 3 months to notice the full benefit from this medicine. You may need to limit your intake tea, coffee, caffeinated sodas, and alcohol. These drinks may make your symptoms worse. You may get drowsy or dizzy. Do not drive, use machinery, or do anything that needs mental alertness until you know how this medicine affects you. Do not stand or sit up quickly, especially if you are an older patient. This reduces the risk of dizzy or fainting spells. Alcohol may interfere with the effect of this medicine. Avoid alcoholic drinks. Your mouth may get dry. Chewing sugarless gum or sucking hard candy, and drinking plenty of water may help. Contact your doctor if the problem does not go away or is severe. This medicine may cause dry eyes and blurred vision. If you wear contact lenses, you may feel some discomfort. Lubricating drops may help. See your eye care professional if the problem does not go away or is severe. Avoid extreme heat. This medicine can cause you to sweat less than normal. Your body temperature could increase to dangerous levels, which may lead to heat stroke. What side effects may I notice from receiving  this medicine? Side effects that you should report to your doctor or health care professional as soon as possible: -allergic reactions like skin rash, itching or hives, swelling of  the face, lips, or tongue -blurred vision or difficulty focusing vision -breathing problems -chest pain or palpitations -confusion -fast, irregular heartbeat -feeling faint or lightheaded, falls -hallucinations -severe dizziness -trouble passing urine or change in the amount of urine -unusually weak or tired Side effects that usually do not require medical attention (report to your doctor or health care professional if they continue or are bothersome): -constipation -drowsiness -headache -indigestion or stomach upset -nausea This list may not describe all possible side effects. Call your doctor for medical advice about side effects. You may report side effects to FDA at 1-800-FDA-1088. Where should I keep my medicine? Keep out of the reach of children. Store at room temperature between 15 and 30 degrees C (59 and 86 degrees F). Throw away any unused medicine after the expiration date. NOTE: This sheet is a summary. It may not cover all possible information. If you have questions about this medicine, talk to your doctor, pharmacist, or health care provider.  2015, [employer] Standard. (2013-01-04 12:42:09)    You may obtain a copy of any labs that were done today by logging onto MyChart as outlined in the instructions provided with your AVS (after visit summary). The office will not call with normal lab results but certainly if there are any significant abnormalities then we will contact you.    Health Maintenance, Female A healthy lifestyle and preventative care can promote health and wellness.  Maintain regular health, dental, and eye exams.  Eat a healthy diet. Foods like vegetables, fruits, whole grains, low-fat dairy products, and lean protein foods contain the nutrients you need without too many calories. Decrease your intake of foods high in solid fats, added sugars, and salt. Get information about a proper diet from your caregiver, if necessary.  Regular physical  exercise is one of the most important things you can do for your health. Most adults should get at least 150 minutes of moderate-intensity exercise (any activity that increases your heart rate and causes you to sweat) each week. In addition, most adults need muscle-strengthening exercises on 2 or more days a week.   Maintain a healthy weight. The body mass index (BMI) is a screening tool to identify possible weight problems. It provides an estimate of body fat based on height and weight. Your caregiver can help determine your BMI, and can help you achieve or maintain a healthy weight. For adults 20 years and older:  A BMI below 18.5 is considered underweight.  A BMI of 18.5 to 24.9 is normal.  A BMI of 25 to 29.9 is considered overweight.  A BMI of 30 and above is considered obese.  Maintain normal blood lipids and cholesterol by exercising and minimizing your intake of saturated fat. Eat a balanced diet with plenty of fruits and vegetables. Blood tests for lipids and cholesterol should begin at age 54 and be repeated every 5 years. If your lipid or cholesterol levels are high, you are over 50, or you are a high risk for heart disease, you may need your cholesterol levels checked more frequently.Ongoing high lipid and cholesterol levels should be treated with medicines if diet and exercise are not effective.  If you smoke, find out from your caregiver how to quit. If you do not use tobacco, do not start.  Lung cancer screening is recommended for adults  aged 59 44 years who are at high risk for developing lung cancer because of a history of smoking. Yearly low-dose computed tomography (CT) is recommended for people who have at least a 30-pack-year history of smoking and are a current smoker or have quit within the past 15 years. A pack year of smoking is smoking an average of 1 pack of cigarettes a day for 1 year (for example: 1 pack a day for 30 years or 2 packs a day for 15 years). Yearly  screening should continue until the smoker has stopped smoking for at least 15 years. Yearly screening should also be stopped for people who develop a health problem that would prevent them from having lung cancer treatment.  If you are pregnant, do not drink alcohol. If you are breastfeeding, be very cautious about drinking alcohol. If you are not pregnant and choose to drink alcohol, do not exceed 1 drink per day. One drink is considered to be 12 ounces (355 mL) of beer, 5 ounces (148 mL) of wine, or 1.5 ounces (44 mL) of liquor.  Avoid use of street drugs. Do not share needles with anyone. Ask for help if you need support or instructions about stopping the use of drugs.  High blood pressure causes heart disease and increases the risk of stroke. Blood pressure should be checked at least every 1 to 2 years. Ongoing high blood pressure should be treated with medicines, if weight loss and exercise are not effective.  If you are 34 to 46 years old, ask your caregiver if you should take aspirin to prevent strokes.  Diabetes screening involves taking a blood sample to check your fasting blood sugar level. This should be done once every 3 years, after age 8, if you are within normal weight and without risk factors for diabetes. Testing should be considered at a younger age or be carried out more frequently if you are overweight and have at least 1 risk factor for diabetes.  Breast cancer screening is essential preventative care for women. You should practice "breast self-awareness." This means understanding the normal appearance and feel of your breasts and may include breast self-examination. Any changes detected, no matter how small, should be reported to a caregiver. Women in their 16s and 30s should have a clinical breast exam (CBE) by a caregiver as part of a regular health exam every 1 to 3 years. After age 52, women should have a CBE every year. Starting at age 75, women should consider having a  mammogram (breast X-ray) every year. Women who have a family history of breast cancer should talk to their caregiver about genetic screening. Women at a high risk of breast cancer should talk to their caregiver about having an MRI and a mammogram every year.  Breast cancer gene (BRCA)-related cancer risk assessment is recommended for women who have family members with BRCA-related cancers. BRCA-related cancers include breast, ovarian, tubal, and peritoneal cancers. Having family members with these cancers may be associated with an increased risk for harmful changes (mutations) in the breast cancer genes BRCA1 and BRCA2. Results of the assessment will determine the need for genetic counseling and BRCA1 and BRCA2 testing.  The Pap test is a screening test for cervical cancer. Women should have a Pap test starting at age 84. Between ages 9 and 3, Pap tests should be repeated every 2 years. Beginning at age 62, you should have a Pap test every 3 years as long as the past 3 Pap tests have  been normal. If you had a hysterectomy for a problem that was not cancer or a condition that could lead to cancer, then you no longer need Pap tests. If you are between ages 78 and 53, and you have had normal Pap tests going back 10 years, you no longer need Pap tests. If you have had past treatment for cervical cancer or a condition that could lead to cancer, you need Pap tests and screening for cancer for at least 20 years after your treatment. If Pap tests have been discontinued, risk factors (such as a new sexual partner) need to be reassessed to determine if screening should be resumed. Some women have medical problems that increase the chance of getting cervical cancer. In these cases, your caregiver may recommend more frequent screening and Pap tests.  The human papillomavirus (HPV) test is an additional test that may be used for cervical cancer screening. The HPV test looks for the virus that can cause the cell changes  on the cervix. The cells collected during the Pap test can be tested for HPV. The HPV test could be used to screen women aged 24 years and older, and should be used in women of any age who have unclear Pap test results. After the age of 69, women should have HPV testing at the same frequency as a Pap test.  Colorectal cancer can be detected and often prevented. Most routine colorectal cancer screening begins at the age of 35 and continues through age 76. However, your caregiver may recommend screening at an earlier age if you have risk factors for colon cancer. On a yearly basis, your caregiver may provide home test kits to check for hidden blood in the stool. Use of a small camera at the end of a tube, to directly examine the colon (sigmoidoscopy or colonoscopy), can detect the earliest forms of colorectal cancer. Talk to your caregiver about this at age 1, when routine screening begins. Direct examination of the colon should be repeated every 5 to 10 years through age 29, unless early forms of pre-cancerous polyps or small growths are found.  Hepatitis C blood testing is recommended for all people born from [birth year] through 1967 and any individual with known risks for hepatitis C.  Practice safe sex. Use condoms and avoid high-risk sexual practices to reduce the spread of sexually transmitted infections (STIs). Sexually active women aged 47 and younger should be checked for Chlamydia, which is a common sexually transmitted infection. Older women with new or multiple partners should also be tested for Chlamydia. Testing for other STIs is recommended if you are sexually active and at increased risk.  Osteoporosis is a disease in which the bones lose minerals and strength with aging. This can result in serious bone fractures. The risk of osteoporosis can be identified using a bone density scan. Women ages 62 and over and women at risk for fractures or osteoporosis should discuss screening with their caregivers.  Ask your caregiver whether you should be taking a calcium supplement or vitamin D to reduce the rate of osteoporosis.  Menopause can be associated with physical symptoms and risks. Hormone replacement therapy is available to decrease symptoms and risks. You should talk to your caregiver about whether hormone replacement therapy is right for you.  Use sunscreen. Apply sunscreen liberally and repeatedly throughout the day. You should seek shade when your shadow is shorter than you. Protect yourself by wearing long sleeves, pants, a wide-brimmed hat, and sunglasses year round, whenever you are  outdoors.  Notify your caregiver of new moles or changes in moles, especially if there is a change in shape or color. Also notify your caregiver if a mole is larger than the size of a pencil eraser.  Stay current with your immunizations. Document Released: 12/23/2010 Document Revised: 10/04/2012 Document Reviewed: 12/23/2010 [facility] Patient Information 2014 [place].

## 2014-08-04 LAB — THYROID PANEL
Free T4: 1.04 ng/dL (ref 0.80–1.80)
T3 Uptake: 26 % (ref 22–35)
T4, Total: 7.8 ug/dL (ref 4.5–12.0)
TSH: 2.339 u[IU]/mL (ref 0.350–4.500)

## 2014-08-04 LAB — URINALYSIS W MICROSCOPIC + REFLEX CULTURE
Bacteria, UA: NONE SEEN
Bilirubin Urine: NEGATIVE
CRYSTALS: NONE SEEN
Casts: NONE SEEN
Glucose, UA: NEGATIVE mg/dL
Hgb urine dipstick: NEGATIVE
Ketones, ur: NEGATIVE mg/dL
LEUKOCYTES UA: NEGATIVE
Nitrite: NEGATIVE
PH: 6 (ref 5.0–8.0)
PROTEIN: NEGATIVE mg/dL
Specific Gravity, Urine: 1.005 (ref 1.005–1.030)
Squamous Epithelial / LPF: NONE SEEN
Urobilinogen, UA: 0.2 mg/dL (ref 0.0–1.0)

## 2014-08-04 LAB — FOLLICLE STIMULATING HORMONE: FSH: 8.1 m[IU]/mL

## 2014-08-04 LAB — VITAMIN D 25 HYDROXY (VIT D DEFICIENCY, FRACTURES): VIT D 25 HYDROXY: 31 ng/mL (ref 30–100)

## 2014-09-20 ENCOUNTER — Other Ambulatory Visit: Payer: Self-pay | Admitting: Gynecology

## 2014-09-20 DIAGNOSIS — Z1231 Encounter for screening mammogram for malignant neoplasm of breast: Secondary | ICD-10-CM

## 2014-09-28 ENCOUNTER — Ambulatory Visit (HOSPITAL_COMMUNITY)
Admission: RE | Admit: 2014-09-28 | Discharge: 2014-09-28 | Disposition: A | Discharge status: Home / Self Care | Payer: BLUE CROSS/BLUE SHIELD | Source: Ambulatory Visit | Attending: Gynecology | Admitting: Gynecology

## 2014-09-28 DIAGNOSIS — Z1231 Encounter for screening mammogram for malignant neoplasm of breast: Secondary | ICD-10-CM | POA: Diagnosis not present

## 2015-11-16 ENCOUNTER — Other Ambulatory Visit: Payer: Self-pay

## 2015-11-16 DIAGNOSIS — Z1231 Encounter for screening mammogram for malignant neoplasm of breast: Secondary | ICD-10-CM

## 2015-11-29 ENCOUNTER — Ambulatory Visit
Admission: RE | Admit: 2015-11-29 | Discharge: 2015-11-29 | Disposition: A | Discharge status: Home / Self Care | Payer: BLUE CROSS/BLUE SHIELD | Source: Ambulatory Visit

## 2015-11-29 DIAGNOSIS — Z1231 Encounter for screening mammogram for malignant neoplasm of breast: Secondary | ICD-10-CM

## 2016-01-04 ENCOUNTER — Encounter: Payer: BLUE CROSS/BLUE SHIELD | Admitting: Gynecology

## 2016-03-03 ENCOUNTER — Ambulatory Visit (INDEPENDENT_AMBULATORY_CARE_PROVIDER_SITE_OTHER): Payer: BLUE CROSS/BLUE SHIELD | Admitting: Gynecology

## 2016-03-03 ENCOUNTER — Encounter: Payer: Self-pay | Admitting: Gynecology

## 2016-03-03 VITALS — BP 114/70 | Ht 64.0 in | Wt 146.0 lb

## 2016-03-03 DIAGNOSIS — N3941 Urge incontinence: Secondary | ICD-10-CM | POA: Diagnosis not present

## 2016-03-03 DIAGNOSIS — Z01419 Encounter for gynecological examination (general) (routine) without abnormal findings: Secondary | ICD-10-CM

## 2016-03-03 NOTE — Patient Instructions (Signed)

## 2016-03-03 NOTE — Progress Notes (Signed)
    Caroline MerrySonia Parkin 02/25/1971 [id number]        45 y.o.  G1P1001  for annual exam.  Doing well without complaints  Past medical history,surgical history, problem list, medications, allergies, family history and social history were all reviewed and documented as reviewed in the EPIC chart.  ROS:  Performed with pertinent positives and negatives included in the history, assessment and plan.   Additional significant findings :  None   Exam: Kennon PortelaKim Gardner assistant Vitals:   03/03/16 1010  BP: 114/70  Weight: 146 lb (66.2 kg)  Height: 5\' 4"  (1.626 m)   Body mass index is 25.06 kg/m.  General appearance:  Normal affect, orientation and appearance. Skin: Grossly normal HEENT: Without gross lesions.  No cervical or supraclavicular adenopathy. Thyroid normal.  Lungs:  Clear without wheezing, rales or rhonchi Cardiac: RR, without RMG Abdominal:  Soft, nontender, without masses, guarding, rebound, organomegaly or hernia Breasts:  Examined lying and sitting without masses, retractions, discharge or axillary adenopathy. Pelvic:  Ext/BUS/Vagina normal  Cervix normal  Uterus anteverted, normal size, shape and contour, midline and mobile nontender   Adnexa without masses or tenderness    Anus and perineum normal   Rectovaginal normal sphincter tone without palpated masses or tenderness.    Assessment/Plan:  45 y.o. 751P1001 female for annual exam with regular menses, vasectomy birth control. History of HerOption endometrial ablation  1. Pap smear/HPV 06/2013 negative. No Pap smear done today. 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 Plan repeat Pap smear approaching 5 year interval per current screening guidelines. 2. Mixed incontinence. Patient has some symptoms of stress and urgency. Is taking the trip and now through her health clinic at work and doing well with this. Is controlling her symptoms well. She'll continue on this through their clinic. 3. Mammography  11/2015. Continue with annual mammography when due. SBE monthly reviewed. 4. Health maintenance. Patient reports routine lab work done through her clinic at work. No lab work done today. Follow up in one year, sooner as needed.   Dara LordsFONTAINE,Julia Kulzer P MD, 10:40 AM 03/03/2016

## 2016-11-14 DIAGNOSIS — Z1231 Encounter for screening mammogram for malignant neoplasm of breast: Secondary | ICD-10-CM | POA: Diagnosis not present

## 2017-05-12 ENCOUNTER — Encounter: Payer: Self-pay | Admitting: Gynecology

## 2017-05-12 ENCOUNTER — Ambulatory Visit (INDEPENDENT_AMBULATORY_CARE_PROVIDER_SITE_OTHER): Payer: BLUE CROSS/BLUE SHIELD | Admitting: Gynecology

## 2017-05-12 VITALS — BP 120/76 | Ht 65.0 in | Wt 153.0 lb

## 2017-05-12 DIAGNOSIS — Z13220 Encounter for screening for lipoid disorders: Secondary | ICD-10-CM

## 2017-05-12 DIAGNOSIS — Z01419 Encounter for gynecological examination (general) (routine) without abnormal findings: Secondary | ICD-10-CM | POA: Diagnosis not present

## 2017-05-12 NOTE — Progress Notes (Signed)
    Caroline Whitehead 05/03/1971 [id number]        46 y.o.  G1P1001 for annual gynecologic exam.  Doing well without complaints.  Past medical history,surgical history, problem list, medications, allergies, family history and social history were all reviewed and documented as reviewed in the EPIC chart.  ROS:  Performed with pertinent positives and negatives included in the history, assessment and plan.   Additional significant findings : None   Exam: Kennon PortelaKim Gardner assistant Vitals:   05/12/17 1501  BP: 120/76  Weight: 153 lb (69.4 kg)  Height: 5\' 5"  (1.651 m)   Body mass index is 25.46 kg/m.  General appearance:  Normal affect, orientation and appearance. Skin: Grossly normal HEENT: Without gross lesions.  No cervical or supraclavicular adenopathy. Thyroid normal.  Lungs:  Clear without wheezing, rales or rhonchi Cardiac: RR, without RMG Abdominal:  Soft, nontender, without masses, guarding, rebound, organomegaly or hernia Breasts:  Examined lying and sitting without masses, retractions, discharge or axillary adenopathy. Pelvic:  Ext, BUS, Vagina: Normal  Cervix: Normal  Uterus: Anteverted, normal size, shape and contour, midline and mobile nontender   Adnexa: Without masses or tenderness    Anus and perineum: Normal   Rectovaginal: Normal sphincter tone without palpated masses or tenderness.    Assessment/Plan:  46 y.o. 51P1001 female for annual gynecologic exam with regular menses, vasectomy birth control.  History of HerOption endometrial ablation  1. Pap smear/HPV 06/2013.  No Pap smear done today.  History of koilocytotic atypia 2010 with normal Pap smears afterwards.  Plan repeat Pap smear at 5-year interval per current screening guidelines. 2. Mammography 11/2016.  Continue with annual mammography when due.  Breast exam normal today.  SBE monthly reviewed. 3. Health maintenance.  Patient requests baseline labs.  Is not fasting but will return for fasting labs.  Future orders  placed for CBC, CMP, lipid profile.  Follow-up in 1 year, sooner as needed.   Dara Lordsimothy P Wilhemenia Camba MD, 3:28 PM 05/12/2017

## 2017-05-12 NOTE — Patient Instructions (Signed)
Follow-up in 1 year for annual exam, sooner as needed. 

## 2017-05-18 ENCOUNTER — Other Ambulatory Visit: Payer: BLUE CROSS/BLUE SHIELD

## 2017-05-18 DIAGNOSIS — Z01419 Encounter for gynecological examination (general) (routine) without abnormal findings: Secondary | ICD-10-CM

## 2017-05-18 DIAGNOSIS — Z13220 Encounter for screening for lipoid disorders: Secondary | ICD-10-CM

## 2017-05-18 LAB — CBC WITH DIFFERENTIAL/PLATELET
Basophils Absolute: 40 cells/uL (ref 0–200)
Basophils Relative: 0.7 %
Eosinophils Absolute: 422 cells/uL (ref 15–500)
Eosinophils Relative: 7.4 %
HCT: 41.1 % (ref 35.0–45.0)
Hemoglobin: 14.1 g/dL (ref 11.7–15.5)
Lymphs Abs: 1317 cells/uL (ref 850–3900)
MCH: 31.1 pg (ref 27.0–33.0)
MCHC: 34.3 g/dL (ref 32.0–36.0)
MCV: 90.5 fL (ref 80.0–100.0)
MPV: 9.9 fL (ref 7.5–12.5)
Monocytes Relative: 7.6 %
NEUTROS PCT: 61.2 %
Neutro Abs: 3488 cells/uL (ref 1500–7800)
PLATELETS: 303 10*3/uL (ref 140–400)
RBC: 4.54 10*6/uL (ref 3.80–5.10)
RDW: 11.8 % (ref 11.0–15.0)
Total Lymphocyte: 23.1 %
WBC: 5.7 10*3/uL (ref 3.8–10.8)
WBCMIX: 433 {cells}/uL (ref 200–950)

## 2017-05-18 LAB — COMPREHENSIVE METABOLIC PANEL
AG Ratio: 1.6 (calc) (ref 1.0–2.5)
ALBUMIN MSPROF: 4.6 g/dL (ref 3.6–5.1)
ALKALINE PHOSPHATASE (APISO): 53 U/L (ref 33–115)
ALT: 14 U/L (ref 6–29)
AST: 22 U/L (ref 10–35)
BILIRUBIN TOTAL: 0.4 mg/dL (ref 0.2–1.2)
BUN: 11 mg/dL (ref 7–25)
CHLORIDE: 102 mmol/L (ref 98–110)
CO2: 28 mmol/L (ref 20–32)
CREATININE: 0.91 mg/dL (ref 0.50–1.10)
Calcium: 9.2 mg/dL (ref 8.6–10.2)
Globulin: 2.8 g/dL (calc) (ref 1.9–3.7)
Glucose, Bld: 99 mg/dL (ref 65–99)
POTASSIUM: 4.4 mmol/L (ref 3.5–5.3)
Sodium: 138 mmol/L (ref 135–146)
Total Protein: 7.4 g/dL (ref 6.1–8.1)

## 2017-05-18 LAB — LIPID PANEL
CHOLESTEROL: 223 mg/dL — AB (ref <200)
HDL: 70 mg/dL (ref >50)
LDL Cholesterol (Calc): 119 mg/dL (calc) — ABNORMAL HIGH
NON-HDL CHOLESTEROL (CALC): 153 mg/dL — AB (ref <130)
Total CHOL/HDL Ratio: 3.2 (calc) (ref <5.0)
Triglycerides: 218 mg/dL — ABNORMAL HIGH (ref <150)

## 2017-05-20 ENCOUNTER — Other Ambulatory Visit: Payer: Self-pay | Admitting: Gynecology

## 2017-05-20 DIAGNOSIS — E7800 Pure hypercholesterolemia, unspecified: Secondary | ICD-10-CM

## 2017-11-20 DIAGNOSIS — Z1231 Encounter for screening mammogram for malignant neoplasm of breast: Secondary | ICD-10-CM | POA: Diagnosis not present

## 2018-05-13 ENCOUNTER — Encounter: Payer: Self-pay | Admitting: Gynecology

## 2018-05-13 ENCOUNTER — Ambulatory Visit (INDEPENDENT_AMBULATORY_CARE_PROVIDER_SITE_OTHER): Payer: BLUE CROSS/BLUE SHIELD | Admitting: Gynecology

## 2018-05-13 VITALS — BP 118/76 | Ht 64.5 in | Wt 149.0 lb

## 2018-05-13 DIAGNOSIS — N912 Amenorrhea, unspecified: Secondary | ICD-10-CM

## 2018-05-13 DIAGNOSIS — N951 Menopausal and female climacteric states: Secondary | ICD-10-CM

## 2018-05-13 DIAGNOSIS — Z1151 Encounter for screening for human papillomavirus (HPV): Secondary | ICD-10-CM | POA: Diagnosis not present

## 2018-05-13 DIAGNOSIS — Z01419 Encounter for gynecological examination (general) (routine) without abnormal findings: Secondary | ICD-10-CM | POA: Diagnosis not present

## 2018-05-13 NOTE — Patient Instructions (Signed)
Follow-up in 1 year for annual exam.  Follow-up sooner if menopausal symptoms worsen and you want to discuss hormone replacement therapy.

## 2018-05-13 NOTE — Addendum Note (Signed)
Addended by: Dayna BarkerGARDNER, Keagen Heinlen K on: 05/13/2018 09:12 AM   Modules accepted: Orders

## 2018-05-13 NOTE — Progress Notes (Signed)
    Caroline Whitehead 10/29/1970 [id number]        47 y.o.  G1P1001 for annual gynecologic exam.  Last menstrual period July.  Notes some hot flushes and sweats.  Taking over-the-counter products which seem to be helping.  Past medical history,surgical history, problem list, medications, allergies, family history and social history were all reviewed and documented as reviewed in the EPIC chart.  ROS:  Performed with pertinent positives and negatives included in the history, assessment and plan.   Additional significant findings : None   Exam: Kennon PortelaKim Gardner assistant Vitals:   05/13/18 0829  BP: 118/76  Weight: 149 lb (67.6 kg)  Height: 5' 4.5" (1.638 m)   Body mass index is 25.18 kg/m.  General appearance:  Normal affect, orientation and appearance. Skin: Grossly normal HEENT: Without gross lesions.  No cervical or supraclavicular adenopathy. Thyroid normal.  Lungs:  Clear without wheezing, rales or rhonchi Cardiac: RR, without RMG Abdominal:  Soft, nontender, without masses, guarding, rebound, organomegaly or hernia Breasts:  Examined lying and sitting without masses, retractions, discharge or axillary adenopathy. Pelvic:  Ext, BUS, Vagina: Normal  Cervix: Normal.  Pap smear/HPV  Uterus: Anteverted, normal size, shape and contour, midline and mobile nontender   Adnexa: Without masses or tenderness    Anus and perineum: Normal   Rectovaginal: Normal sphincter tone without palpated masses or tenderness.    Assessment/Plan:  47 y.o. 691P1001 female for annual gynecologic exam, vasectomy birth control.   1. Menopausal symptoms.  Last menstrual period July.  No bleeding since.  History of HerOption endometrial ablation in the past.  Discussed what to expect in the perimenopause.  She is using OTC products which are helpful.  Is not interested in pursuing HRT at this time.  Will keep menstrual calendar and as long as no prolonged or atypical bleeding will follow.  If menopausal symptoms  worsen and she wants to discuss HRT or she has abnormal bleeding she will follow-up for further discussion/evaluation. 2. Pap smear/HPV 06/2013.  Pap smear/HPV today.  History of koilocytotic atypia 2010 with normal Pap smears afterwards. 3. Mammography 11/2017.  Continue with annual mammography when due.  Breast exam normal today. 4. Health maintenance.  Patient had routine lab work done at work.  CBC and CMP normal.  Lipid profile showed total cholesterol 207, LDL 107, HDL 85.  Follow-up in 1 year, sooner as needed.   Dara Lordsimothy P Manases Etchison MD, 9:00 AM 05/13/2018

## 2018-05-17 LAB — PAP IG AND HPV HIGH-RISK: HPV DNA HIGH RISK: NOT DETECTED

## 2019-03-16 ENCOUNTER — Encounter: Payer: Self-pay | Admitting: Gynecology

## 2019-05-13 ENCOUNTER — Other Ambulatory Visit: Payer: Self-pay

## 2019-05-16 ENCOUNTER — Encounter: Payer: Self-pay | Admitting: Gynecology

## 2019-05-16 ENCOUNTER — Other Ambulatory Visit: Payer: Self-pay

## 2019-05-16 ENCOUNTER — Ambulatory Visit (INDEPENDENT_AMBULATORY_CARE_PROVIDER_SITE_OTHER): Payer: BC Managed Care – PPO | Admitting: Gynecology

## 2019-05-16 VITALS — BP 118/76 | Ht 64.0 in | Wt 156.0 lb

## 2019-05-16 DIAGNOSIS — Z01419 Encounter for gynecological examination (general) (routine) without abnormal findings: Secondary | ICD-10-CM

## 2019-05-16 DIAGNOSIS — Z13220 Encounter for screening for lipoid disorders: Secondary | ICD-10-CM

## 2019-05-16 NOTE — Progress Notes (Signed)
    Caroline Whitehead 1970-11-03 [id number]        48 y.o.  G1P1001 for annual gynecologic exam.  Without gynecologic complaints  Past medical history,surgical history, problem list, medications, allergies, family history and social history were all reviewed and documented as reviewed in the EPIC chart.  ROS:  Performed with pertinent positives and negatives included in the history, assessment and plan.   Additional significant findings : None   Exam: Caryn Bee assistant Vitals:   05/16/19 0839  BP: 118/76  Weight: 156 lb (70.8 kg)  Height: 5\' 4"  (1.626 m)   Body mass index is 26.78 kg/m.  General appearance:  Normal affect, orientation and appearance. Skin: Grossly normal HEENT: Without gross lesions.  No cervical or supraclavicular adenopathy. Thyroid normal.  Lungs:  Clear without wheezing, rales or rhonchi Cardiac: RR, without RMG Abdominal:  Soft, nontender, without masses, guarding, rebound, organomegaly or hernia Breasts:  Examined lying and sitting without masses, retractions, discharge or axillary adenopathy. Pelvic:  Ext, BUS, Vagina: Normal  Cervix: Normal  Uterus: Anteverted, normal size, shape and contour, midline and mobile nontender   Adnexa: Without masses or tenderness    Anus and perineum: Normal   Rectovaginal: Normal sphincter tone without palpated masses or tenderness.    Assessment/Plan:  48 y.o. G3P1001 female for annual gynecologic exam.  Without menses, vasectomy birth control  1. Postmenopausal.  Last menstrual period over a year ago.  No bleeding since.  No significant menopausal symptoms. 2. Pap smear/HPV 2019.  No Pap smear done today.  History of koilocytotic atypia 2010 with normal Pap smears since.  Plan repeat Pap smear/HPV at 5-year interval per current screening guidelines. 3. Mammography being arranged through work.  Breast exam normal today. 4. Coming due for screening colonoscopy.  Plan next year. 5. Health maintenance.  Baseline CBC, CMP,  lipid profile ordered.  Follow-up 1 year, sooner as needed.   Anastasio Auerbach MD, 9:02 AM 05/16/2019

## 2019-05-16 NOTE — Patient Instructions (Signed)
Follow-up in 1 year for annual exam, sooner if any issues. 

## 2019-05-17 LAB — CBC WITH DIFFERENTIAL/PLATELET
Absolute Monocytes: 410 cells/uL (ref 200–950)
Basophils Absolute: 20 cells/uL (ref 0–200)
Basophils Relative: 0.4 %
Eosinophils Absolute: 180 cells/uL (ref 15–500)
Eosinophils Relative: 3.6 %
HCT: 40.6 % (ref 35.0–45.0)
Hemoglobin: 13.8 g/dL (ref 11.7–15.5)
Lymphs Abs: 1590 cells/uL (ref 850–3900)
MCH: 31.2 pg (ref 27.0–33.0)
MCHC: 34 g/dL (ref 32.0–36.0)
MCV: 91.9 fL (ref 80.0–100.0)
MPV: 10.1 fL (ref 7.5–12.5)
Monocytes Relative: 8.2 %
Neutro Abs: 2800 cells/uL (ref 1500–7800)
Neutrophils Relative %: 56 %
Platelets: 274 10*3/uL (ref 140–400)
RBC: 4.42 10*6/uL (ref 3.80–5.10)
RDW: 11.8 % (ref 11.0–15.0)
Total Lymphocyte: 31.8 %
WBC: 5 10*3/uL (ref 3.8–10.8)

## 2019-05-17 LAB — COMPREHENSIVE METABOLIC PANEL
AG Ratio: 2 (calc) (ref 1.0–2.5)
ALT: 19 U/L (ref 6–29)
AST: 26 U/L (ref 10–35)
Albumin: 4.6 g/dL (ref 3.6–5.1)
Alkaline phosphatase (APISO): 73 U/L (ref 37–153)
BUN: 14 mg/dL (ref 7–25)
CO2: 25 mmol/L (ref 20–32)
Calcium: 9.4 mg/dL (ref 8.6–10.4)
Chloride: 101 mmol/L (ref 98–110)
Creat: 0.84 mg/dL (ref 0.50–1.05)
Globulin: 2.3 g/dL (calc) (ref 1.9–3.7)
Glucose, Bld: 86 mg/dL (ref 65–99)
Potassium: 4 mmol/L (ref 3.5–5.3)
Sodium: 138 mmol/L (ref 135–146)
Total Bilirubin: 0.4 mg/dL (ref 0.2–1.2)
Total Protein: 6.9 g/dL (ref 6.1–8.1)

## 2019-05-17 LAB — LIPID PANEL
Cholesterol: 191 mg/dL (ref <200)
HDL: 71 mg/dL (ref >=50)
LDL Cholesterol (Calc): 96 mg/dL (calc)
Non-HDL Cholesterol (Calc): 120 mg/dL (calc) (ref <130)
Total CHOL/HDL Ratio: 2.7 (calc) (ref <5.0)
Triglycerides: 144 mg/dL (ref <150)

## 2019-07-05 DIAGNOSIS — Z1231 Encounter for screening mammogram for malignant neoplasm of breast: Secondary | ICD-10-CM | POA: Diagnosis not present

## 2020-05-21 ENCOUNTER — Ambulatory Visit (INDEPENDENT_AMBULATORY_CARE_PROVIDER_SITE_OTHER): Payer: BC Managed Care – PPO | Admitting: Obstetrics and Gynecology

## 2020-05-21 ENCOUNTER — Other Ambulatory Visit: Payer: Self-pay

## 2020-05-21 ENCOUNTER — Encounter: Payer: Self-pay | Admitting: Obstetrics and Gynecology

## 2020-05-21 VITALS — BP 112/70 | Ht 63.5 in | Wt 155.0 lb

## 2020-05-21 DIAGNOSIS — Z13220 Encounter for screening for lipoid disorders: Secondary | ICD-10-CM | POA: Diagnosis not present

## 2020-05-21 DIAGNOSIS — Z01419 Encounter for gynecological examination (general) (routine) without abnormal findings: Secondary | ICD-10-CM

## 2020-05-21 DIAGNOSIS — Z8349 Family history of other endocrine, nutritional and metabolic diseases: Secondary | ICD-10-CM | POA: Diagnosis not present

## 2020-05-21 NOTE — Progress Notes (Signed)
   Caroline Whitehead [birth year] [id number]  SUBJECTIVE:  49 y.o. G1P1001 female for annual routine gynecologic exam. She has no gynecologic concerns.  Current Outpatient Medications  Medication Sig Dispense Refill  . multivitamin (THERAGRAN) per tablet Take 1 tablet by mouth daily.      Marland Kitchen oxybutynin (DITROPAN) 5 MG tablet Take 5 mg by mouth 3 (three) times daily.    . Rhubarb (ESTROVEN COMPLETE PO) Take by mouth.     No current facility-administered medications for this visit.   Allergies: Patient has no known allergies.  Patient's last menstrual period was 12/21/2017.  Past medical history,surgical history, problem list, medications, allergies, family history and social history were all reviewed and documented as reviewed in the EPIC chart.  ROS: Pertinent positives and negatives as reviewed in HPI   OBJECTIVE:  BP 112/70   Ht 5' 3.5" (1.613 m)   Wt 155 lb (70.3 kg)   LMP 12/21/2017   BMI 27.03 kg/m  The patient appears well, alert, oriented, in no distress. ENT normal.  Neck supple. No cervical or supraclavicular adenopathy or thyromegaly.  Lungs are clear, good air entry, no wheezes, rhonchi or rales. S1 and S2 normal, no murmurs, regular rate and rhythm.  Abdomen soft without tenderness, guarding, mass or organomegaly.  Neurological is normal, no focal findings.  BREAST EXAM: breasts appear normal, no suspicious masses, no skin or nipple changes or axillary nodes  PELVIC EXAM: VULVA: normal appearing vulva with mild atrophic change, no masses, tenderness or lesions, VAGINA: normal appearing vagina with normal color and discharge, no lesions, CERVIX: normal atrophic appearing cervix without discharge or lesions, UTERUS: uterus is normal size, shape, consistency and nontender, ADNEXA: normal adnexa in size, nontender and no masses  Chaperone: Berna Spare present during the examination  ASSESSMENT:  49 y.o. G1P1001 here for annual gynecologic exam  PLAN:   1.  Postmenopausal.  Mild night sweats and hot flashes, symptoms controlled with OTC Estroven.  Discussed also could add soy supplements/dietary if inadequate symptom relief.  No vaginal bleeding since LMP 2 years ago. 2. Pap smear/HPV 2019.  History of koilocytotic atypia 2010 with normal Pap smears since.  Next Pap smear due 2024 following the current guidelines recommending the 5 year interval. 3. Mammogram 06/2019.  Normal breast exam today.  She is reminded to schedule an annual mammogram when due. 4. Colonoscopy has not yet been completed.  We reviewed that colon cancer screening is recommended and she is aware that she is due and will plan to get this scheduled.  Names and contacts are available at checkout upon request. 5. Health maintenance.  She will proceed to lab today for routine screening blood work (lipids, CBC, CMP), and will check TSH due to family history of thyroid disease.  Return annually or sooner, prn.  Theresia Majors MD 05/21/20

## 2020-05-22 LAB — TSH: TSH: 1.82 mIU/L

## 2020-05-22 LAB — COMPREHENSIVE METABOLIC PANEL
AG Ratio: 1.7 (calc) (ref 1.0–2.5)
ALT: 20 U/L (ref 6–29)
AST: 22 U/L (ref 10–35)
Albumin: 4.5 g/dL (ref 3.6–5.1)
Alkaline phosphatase (APISO): 62 U/L (ref 37–153)
BUN: 13 mg/dL (ref 7–25)
CO2: 26 mmol/L (ref 20–32)
Calcium: 9.5 mg/dL (ref 8.6–10.4)
Chloride: 103 mmol/L (ref 98–110)
Creat: 0.8 mg/dL (ref 0.50–1.05)
Globulin: 2.7 g/dL (calc) (ref 1.9–3.7)
Glucose, Bld: 88 mg/dL (ref 65–99)
Potassium: 4.6 mmol/L (ref 3.5–5.3)
Sodium: 138 mmol/L (ref 135–146)
Total Bilirubin: 0.4 mg/dL (ref 0.2–1.2)
Total Protein: 7.2 g/dL (ref 6.1–8.1)

## 2020-05-22 LAB — CBC
HCT: 41.3 % (ref 35.0–45.0)
Hemoglobin: 13.8 g/dL (ref 11.7–15.5)
MCH: 30.2 pg (ref 27.0–33.0)
MCHC: 33.4 g/dL (ref 32.0–36.0)
MCV: 90.4 fL (ref 80.0–100.0)
MPV: 9.9 fL (ref 7.5–12.5)
Platelets: 339 10*3/uL (ref 140–400)
RBC: 4.57 10*6/uL (ref 3.80–5.10)
RDW: 12 % (ref 11.0–15.0)
WBC: 5.2 10*3/uL (ref 3.8–10.8)

## 2020-05-22 LAB — LIPID PANEL
Cholesterol: 221 mg/dL — ABNORMAL HIGH (ref <200)
HDL: 76 mg/dL (ref >=50)
LDL Cholesterol (Calc): 118 mg/dL (calc) — ABNORMAL HIGH
Non-HDL Cholesterol (Calc): 145 mg/dL (calc) — ABNORMAL HIGH (ref <130)
Total CHOL/HDL Ratio: 2.9 (calc) (ref <5.0)
Triglycerides: 158 mg/dL — ABNORMAL HIGH (ref <150)

## 2020-07-05 DIAGNOSIS — Z1231 Encounter for screening mammogram for malignant neoplasm of breast: Secondary | ICD-10-CM | POA: Diagnosis not present

## 2021-05-23 ENCOUNTER — Encounter: Payer: BC Managed Care – PPO | Admitting: Obstetrics and Gynecology

## 2021-05-23 ENCOUNTER — Ambulatory Visit: Payer: BC Managed Care – PPO | Admitting: Obstetrics and Gynecology

## 2021-06-07 ENCOUNTER — Ambulatory Visit (INDEPENDENT_AMBULATORY_CARE_PROVIDER_SITE_OTHER): Payer: BC Managed Care – PPO | Admitting: Obstetrics and Gynecology

## 2021-06-07 ENCOUNTER — Encounter: Payer: Self-pay | Admitting: Obstetrics and Gynecology

## 2021-06-07 ENCOUNTER — Other Ambulatory Visit: Payer: Self-pay

## 2021-06-07 VITALS — BP 110/70 | HR 104 | Ht 63.5 in | Wt 155.0 lb

## 2021-06-07 DIAGNOSIS — Z01419 Encounter for gynecological examination (general) (routine) without abnormal findings: Secondary | ICD-10-CM

## 2021-06-07 NOTE — Progress Notes (Signed)
50 y.o. G53P1001 Married Caucasian female here for annual exam.    No menstruation.  Had an ablation.  Feels hot at night, manageable.  Estroven helps.  No HRT.   Works at [employer].  Has a daughter, 80 yo, Sr. at [employer].  PCP:  Darrin Nipper, MD   Patient's last menstrual period was 12/21/2017.           Sexually active: Yes.    The current method of family planning is post menopausal status.    Exercising: Yes.     Cardio, treadmill, walking Smoker:  no  Health Maintenance: Pap:  05-13-18 Neg:Neg HR HPV, 07-11-13 Neg:Neg HR HPV, 05-30-11 Neg History of abnormal Pap:  yes, Hx of cryotherapy and LEEP procedure to cervix in her 20's. Paps normal since. MMG: 07-05-20 Neg/BiRads1--[facility] Colonoscopy:  Scheduled in 07/2021 BMD:   n/a  Result  n/a TDaP:  PCP Gardasil:   no HIV:Neg years ago Hep C:Neg years ago Screening Labs:  PCP at work.  Flu vaccine:  Completed.  Covid booster x 1.   reports that she has never smoked. She has never used smokeless tobacco. She reports current alcohol use of about 2.0 standard drinks per week. She reports that she does not use drugs.  Past Medical History:  Diagnosis Date   Cervical atypia 06/23/2008   Koilocytotic Atypia   Overactive bladder     Past Surgical History:  Procedure Laterality Date   COLPOSCOPY  04/2009   ENDOMETRIAL ABLATION  06/28/10   her option   TONSILLECTOMY AND ADENOIDECTOMY     age 1    Current Outpatient Medications  Medication Sig Dispense Refill   multivitamin (THERAGRAN) per tablet Take 1 tablet by mouth daily.       oxybutynin (DITROPAN) 5 MG tablet Take 5 mg by mouth daily.     Rhubarb (ESTROVEN COMPLETE PO) Take by mouth.     No current facility-administered medications for this visit.    Family History  Problem Relation Age of Onset   Hypertension Mother    Heart disease Mother    Dementia Father    Heart disease Father    Cancer Paternal Aunt        colon cancer and ovarian    Hypertension Paternal Grandfather    Heart disease Paternal Grandfather     Review of Systems  All other systems reviewed and are negative.  Exam:   BP 110/70    Pulse (!) 104    Ht 5' 3.5" (1.613 m)    Wt 155 lb (70.3 kg)    LMP 12/21/2017    SpO2 98%    BMI 27.03 kg/m     General appearance: alert, cooperative and appears stated age Head: normocephalic, without obvious abnormality, atraumatic Neck: no adenopathy, supple, symmetrical, trachea midline and thyroid normal to inspection and palpation Lungs: clear to auscultation bilaterally Breasts: normal appearance, no masses or tenderness, No nipple retraction or dimpling, No nipple discharge or bleeding, No axillary adenopathy Heart: regular rate and rhythm Abdomen: soft, non-tender; no masses, no organomegaly Extremities: extremities normal, atraumatic, no cyanosis or edema Skin: skin color, texture, turgor normal. No rashes or lesions Lymph nodes: cervical, supraclavicular, and axillary nodes normal. Neurologic: grossly normal  Pelvic: External genitalia:  no lesions              No abnormal inguinal nodes palpated.              Urethra:  normal appearing urethra with  no masses, tenderness or lesions              Bartholins and Skenes: normal                 Vagina: normal appearing vagina with normal color and discharge, no lesions              Cervix: no lesions.  Cervix flush with vaginal cuff posteriorly.              Pap taken: no Bimanual Exam:  Uterus:  normal size, contour, position, consistency, mobility, non-tender              Adnexa: no mass, fullness, tenderness              Rectal exam: yes.  Confirms.              Anus:  normal sphincter tone, no lesions  Chaperone was present for exam:  Marchelle Folks, CMA  Assessment:   Well woman visit with gynecologic exam. Remote hx of LEEP. Hx endometrial ablation.  Overactive bladder.  Plan: Mammogram screening discussed.   Self breast awareness reviewed. Pap and HR HPV  2024. Guidelines for Calcium, Vitamin D, regular exercise program including cardiovascular and weight bearing exercise. She will have her colonoscopy report sent to this office.  Labs at work.   Follow up annually and prn.   After visit summary provided.

## 2021-06-07 NOTE — Patient Instructions (Signed)

## 2021-11-04 DIAGNOSIS — D128 Benign neoplasm of rectum: Secondary | ICD-10-CM | POA: Diagnosis not present

## 2021-11-04 DIAGNOSIS — Z1211 Encounter for screening for malignant neoplasm of colon: Secondary | ICD-10-CM | POA: Diagnosis not present

## 2021-11-04 DIAGNOSIS — D120 Benign neoplasm of cecum: Secondary | ICD-10-CM | POA: Diagnosis not present

## 2021-11-04 DIAGNOSIS — K648 Other hemorrhoids: Secondary | ICD-10-CM | POA: Diagnosis not present

## 2021-12-20 DIAGNOSIS — L905 Scar conditions and fibrosis of skin: Secondary | ICD-10-CM | POA: Diagnosis not present

## 2021-12-20 DIAGNOSIS — L218 Other seborrheic dermatitis: Secondary | ICD-10-CM | POA: Diagnosis not present

## 2021-12-20 DIAGNOSIS — L820 Inflamed seborrheic keratosis: Secondary | ICD-10-CM | POA: Diagnosis not present

## 2022-01-16 DIAGNOSIS — Z1379 Encounter for other screening for genetic and chromosomal anomalies: Secondary | ICD-10-CM | POA: Diagnosis not present

## 2022-01-16 DIAGNOSIS — Z820 Family history of epilepsy and other diseases of the nervous system: Secondary | ICD-10-CM | POA: Diagnosis not present

## 2022-02-27 DIAGNOSIS — Z1231 Encounter for screening mammogram for malignant neoplasm of breast: Secondary | ICD-10-CM | POA: Diagnosis not present

## 2022-05-29 NOTE — Progress Notes (Signed)
51 y.o. G3P1001 Married Caucasian female here for annual exam.    Taking over the counter Estroven.  No consistent hot flashes.  No spotting or bleeding.   PCP:   Dan Europe, MD - at work  Patient's last menstrual period was 12/21/2017.           Sexually active: Yes.    The current method of family planning is post menopausal status.    Exercising: Yes.     Walking daily. Smoker:  no  Health Maintenance: Pap:  05-13-18 Neg:Neg HR HPV, 07-11-13 Neg:Neg HR HPV, 05-30-11 Neg  History of abnormal Pap:  yes, Hx of cryotherapy and LEEP procedure to cervix in her 20's. Paps normal since.  MMG: 02/27/22 BI-RADS CATEGORY 1 Negative ([facility]), 11/29/15, Breast Density Category C, BI-RADS CATEGORY 1 Negative Colonoscopy:  May 2023 per pt, normal per pt BMD:   n/a  Result  n/a TDaP:  PCP Gardasil:   no HIV: neg years ago Hep C: neg years ago Screening Labs:  PCP at [facility]rok Flu vaccine:  completed.    reports that she has never smoked. She has never used smokeless tobacco. She reports current alcohol use of about 2.0 standard drinks of alcohol per week. She reports that she does not use drugs.  Past Medical History:  Diagnosis Date   Cervical atypia 06/23/2008   Koilocytotic Atypia   Overactive bladder     Past Surgical History:  Procedure Laterality Date   CERVICAL BIOPSY  W/ LOOP ELECTRODE EXCISION     early 1990s   COLPOSCOPY  04/23/2009   ENDOMETRIAL ABLATION  06/28/2010   her option   TONSILLECTOMY AND ADENOIDECTOMY     age 47    Current Outpatient Medications  Medication Sig Dispense Refill   multivitamin (THERAGRAN) per tablet Take 1 tablet by mouth daily.       oxybutynin (DITROPAN) 5 MG tablet Take 5 mg by mouth daily.     Rhubarb (ESTROVEN COMPLETE PO) Take by mouth.     No current facility-administered medications for this visit.    Family History  Problem Relation Age of Onset   Hypertension Mother    Heart disease Mother    Dementia Father    Heart disease  Father    Cancer Paternal Aunt        colon cancer and ovarian   Hypertension Paternal Grandfather    Heart disease Paternal Grandfather     Review of Systems  All other systems reviewed and are negative.   Exam:   BP 122/78 (BP Location: Right Arm, Patient Position: Sitting, Cuff Size: Normal)   Pulse 67   Ht 5' 4.5" (1.638 m)   Wt 155 lb (70.3 kg)   LMP 12/21/2017   SpO2 97%   BMI 26.19 kg/m     General appearance: alert, cooperative and appears stated age Head: normocephalic, without obvious abnormality, atraumatic Neck: no adenopathy, supple, symmetrical, trachea midline and thyroid normal to inspection and palpation Lungs: clear to auscultation bilaterally Breasts: normal appearance, no masses or tenderness, No nipple retraction or dimpling, No nipple discharge or bleeding, No axillary adenopathy Heart: regular rate and rhythm Abdomen: soft, non-tender; no masses, no organomegaly Extremities: extremities normal, atraumatic, no cyanosis or edema Skin: skin color, texture, turgor normal. No rashes or lesions Lymph nodes: cervical, supraclavicular, and axillary nodes normal. Neurologic: grossly normal  Pelvic: External genitalia:  no lesions              No abnormal inguinal nodes  palpated.              Urethra:  normal appearing urethra with no masses, tenderness or lesions              Bartholins and Skenes: normal                 Vagina: normal appearing vagina with normal color and discharge, no lesions              Cervix: no lesions              Pap taken: no Bimanual Exam:  Uterus:  normal size, contour, position, consistency, mobility, non-tender              Adnexa: no mass, fullness, tenderness              Rectal exam: yes.  Confirms.              Anus:  normal sphincter tone, no lesions  Chaperone was present for exam:  Irving Burton  Assessment:   Well woman visit with gynecologic exam. Remote hx of LEEP. Hx endometrial ablation.  Overactive  bladder.  Plan: Mammogram screening discussed. Self breast awareness reviewed. Pap and HR HPV 2024. Guidelines for Calcium, Vitamin D, regular exercise program including cardiovascular and weight bearing exercise. Will get a copy of her colonoscopy.  Follow up annually and prn.   After visit summary provided.

## 2022-06-12 ENCOUNTER — Encounter: Payer: Self-pay | Admitting: Obstetrics and Gynecology

## 2022-06-12 ENCOUNTER — Ambulatory Visit (INDEPENDENT_AMBULATORY_CARE_PROVIDER_SITE_OTHER): Payer: BC Managed Care – PPO | Admitting: Obstetrics and Gynecology

## 2022-06-12 VITALS — BP 122/78 | HR 67 | Ht 64.5 in | Wt 155.0 lb

## 2022-06-12 DIAGNOSIS — Z01419 Encounter for gynecological examination (general) (routine) without abnormal findings: Secondary | ICD-10-CM | POA: Diagnosis not present

## 2022-06-12 NOTE — Patient Instructions (Signed)

## 2023-03-05 DIAGNOSIS — R92323 Mammographic fibroglandular density, bilateral breasts: Secondary | ICD-10-CM | POA: Diagnosis not present

## 2023-03-05 DIAGNOSIS — Z1231 Encounter for screening mammogram for malignant neoplasm of breast: Secondary | ICD-10-CM | POA: Diagnosis not present

## 2023-06-08 ENCOUNTER — Encounter: Payer: Self-pay | Admitting: Obstetrics and Gynecology

## 2023-06-08 NOTE — Progress Notes (Unsigned)
52 y.o. G38P1001 Married Caucasian female here for annual exam.    Occasional hot flash at night.  No vaginal bleeding.    Ditropan is working well.  Taking 5 mg daily. PCP prescribing.   Labs done with PCP.   Retired and moved.  Youngest daughter married this year.   PCP: Elson Clan, FNP   Patient's last menstrual period was 12/21/2017.           Sexually active: Yes.    The current method of family planning is vasectomy.    Menopausal hormone therapy:  n/a Exercising: Yes.     walking Smoker:  no  OB History  Gravida Para Term Preterm AB Living  1 1 1   1   SAB IAB Ectopic Multiple Live Births          # Outcome Date GA Lbr Len/2nd Weight Sex Type Anes PTL Lv  1 Term              HEALTH MAINTENANCE: Last 2 paps:  05/13/18 neg: HR HPV neg History of abnormal Pap or positive HPV:  yes, Hx of cryotherapy and LEEP procedure to cervix in her 20's. Paps normal since  Mammogram:  03/05/23 Breast Density Cat C, BI-RADS CATEGORY 1 Negative ([facility]) Colonoscopy:  May 2023 per pt - due in 2028 Bone Density:  n/a  Result  n/a    There is no immunization history on file for this patient.    reports that she has never smoked. She has never used smokeless tobacco. She reports current alcohol use of about 2.0 standard drinks of alcohol per week. She reports that she does not use drugs.  Past Medical History:  Diagnosis Date   Cervical atypia 06/23/2008   Koilocytotic Atypia   Overactive bladder     Past Surgical History:  Procedure Laterality Date   CERVICAL BIOPSY  W/ LOOP ELECTRODE EXCISION     early 1990s   COLPOSCOPY  04/23/2009   ENDOMETRIAL ABLATION  06/28/2010   her option   TONSILLECTOMY AND ADENOIDECTOMY     age 51    Current Outpatient Medications  Medication Sig Dispense Refill   Cholecalciferol (VITAMIN D-1000 MAX ST) 25 MCG (1000 UT) tablet Take by mouth.     multivitamin (THERAGRAN) per tablet Take 1 tablet by mouth daily.        oxybutynin (DITROPAN) 5 MG tablet Take 5 mg by mouth daily.     Rhubarb (ESTROVEN COMPLETE PO) Take by mouth.     No current facility-administered medications for this visit.    ALLERGIES: Patient has no known allergies.  Family History  Problem Relation Age of Onset   Hypertension Mother    Heart disease Mother    Dementia Father    Heart disease Father    Cancer Paternal Aunt        colon cancer and ovarian   Hypertension Paternal Grandfather    Heart disease Paternal Grandfather     Review of Systems  All other systems reviewed and are negative.   PHYSICAL EXAM:  BP 124/76 (BP Location: Left Arm, Patient Position: Sitting, Cuff Size: Small)   Pulse 71   Ht 5\' 4"  (1.626 m)   Wt 155 lb (70.3 kg)   LMP 12/21/2017   SpO2 99%   BMI 26.61 kg/m     General appearance: alert, cooperative and appears stated age Head: normocephalic, without obvious abnormality, atraumatic Neck: no adenopathy, supple, symmetrical, trachea midline and thyroid normal to inspection  and palpation Lungs: clear to auscultation bilaterally Breasts: normal appearance, no masses or tenderness, No nipple retraction or dimpling, No nipple discharge or bleeding, No axillary adenopathy Heart: regular rate and rhythm Abdomen: soft, non-tender; no masses, no organomegaly Extremities: extremities normal, atraumatic, no cyanosis or edema Skin: skin color, texture, turgor normal. No rashes or lesions Lymph nodes: cervical, supraclavicular, and axillary nodes normal. Neurologic: grossly normal  Pelvic: External genitalia:  no lesions              No abnormal inguinal nodes palpated.              Urethra:  normal appearing urethra with no masses, tenderness or lesions              Bartholins and Skenes: normal                 Vagina: normal appearing vagina with normal color and discharge, no lesions              Cervix: no lesions              Pap taken: {yes no:314532} Bimanual Exam:  Uterus:  normal size,  contour, position, consistency, mobility, non-tender              Adnexa: no mass, fullness, tenderness              Rectal exam: {yes no:314532}.  Confirms.              Anus:  normal sphincter tone, no lesions  Chaperone was present for exam:  {BSCHAPERONE:31226::"Emily F, CMA"}  ASSESSMENT: Well woman visit with gynecologic exam Remote hx of LEEP. Hx endometrial ablation.  Overactive bladder.  PLAN: Mammogram screening discussed. Self breast awareness reviewed. Pap and HRV collected:  {yes no:314532} Guidelines for Calcium, Vitamin D, regular exercise program including cardiovascular and weight bearing exercise. Medication refills:  *** {LABS (Optional):23779} Follow up:  ***    Additional counseling given.  {yes [id number]. ***  total time was spent for this patient encounter, including preparation, face-to-face counseling with the patient, coordination of care, and documentation of the encounter in addition to doing the well woman visit with gynecologic exam.

## 2023-06-18 ENCOUNTER — Ambulatory Visit: Payer: BC Managed Care – PPO | Admitting: Obstetrics and Gynecology

## 2023-06-22 ENCOUNTER — Other Ambulatory Visit (HOSPITAL_COMMUNITY)
Admission: RE | Admit: 2023-06-22 | Discharge: 2023-06-22 | Disposition: A | Discharge status: Home / Self Care | Payer: BC Managed Care – PPO | Source: Ambulatory Visit | Attending: Obstetrics and Gynecology | Admitting: Obstetrics and Gynecology

## 2023-06-22 ENCOUNTER — Encounter: Payer: Self-pay | Admitting: Obstetrics and Gynecology

## 2023-06-22 ENCOUNTER — Ambulatory Visit (INDEPENDENT_AMBULATORY_CARE_PROVIDER_SITE_OTHER): Payer: BC Managed Care – PPO | Admitting: Obstetrics and Gynecology

## 2023-06-22 VITALS — BP 124/76 | HR 71 | Ht 64.0 in | Wt 155.0 lb

## 2023-06-22 DIAGNOSIS — Z01419 Encounter for gynecological examination (general) (routine) without abnormal findings: Secondary | ICD-10-CM

## 2023-06-22 DIAGNOSIS — Z124 Encounter for screening for malignant neoplasm of cervix: Secondary | ICD-10-CM | POA: Diagnosis not present

## 2023-06-22 NOTE — Patient Instructions (Signed)

## 2023-06-25 LAB — CYTOLOGY - PAP
Comment: NEGATIVE
Diagnosis: NEGATIVE
High risk HPV: NEGATIVE

## 2023-11-20 DIAGNOSIS — D225 Melanocytic nevi of trunk: Secondary | ICD-10-CM | POA: Diagnosis not present

## 2023-11-20 DIAGNOSIS — Z1283 Encounter for screening for malignant neoplasm of skin: Secondary | ICD-10-CM | POA: Diagnosis not present

## 2024-03-17 DIAGNOSIS — Z1231 Encounter for screening mammogram for malignant neoplasm of breast: Secondary | ICD-10-CM | POA: Diagnosis not present

## 2024-03-17 DIAGNOSIS — R92323 Mammographic fibroglandular density, bilateral breasts: Secondary | ICD-10-CM | POA: Diagnosis not present

## 2024-03-17 LAB — HM MAMMOGRAPHY

## 2024-03-21 ENCOUNTER — Encounter: Payer: Self-pay | Admitting: Obstetrics and Gynecology

## 2024-03-21 ENCOUNTER — Ambulatory Visit: Payer: Self-pay | Admitting: Obstetrics and Gynecology

## 2024-03-22 DIAGNOSIS — E559 Vitamin D deficiency, unspecified: Secondary | ICD-10-CM | POA: Diagnosis not present

## 2024-03-22 DIAGNOSIS — Z0000 Encounter for general adult medical examination without abnormal findings: Secondary | ICD-10-CM | POA: Diagnosis not present

## 2024-03-22 DIAGNOSIS — R5383 Other fatigue: Secondary | ICD-10-CM | POA: Diagnosis not present

## 2024-03-28 ENCOUNTER — Encounter: Payer: Self-pay | Admitting: Obstetrics and Gynecology
